# Patient Record
Sex: Female | Born: 1950 | Race: White | Hispanic: No | Smoking: Former smoker
Health system: Southern US, Community
[De-identification: ages and names within clinical notes are randomized; demographics above are authoritative.]

## PROBLEM LIST (undated history)

## (undated) DIAGNOSIS — F329 Major depressive disorder, single episode, unspecified: Secondary | ICD-10-CM

## (undated) DIAGNOSIS — K219 Gastro-esophageal reflux disease without esophagitis: Secondary | ICD-10-CM

## (undated) DIAGNOSIS — IMO0001 Reserved for inherently not codable concepts without codable children: Secondary | ICD-10-CM

## (undated) DIAGNOSIS — IMO0002 Reserved for concepts with insufficient information to code with codable children: Secondary | ICD-10-CM

## (undated) DIAGNOSIS — F32A Depression, unspecified: Secondary | ICD-10-CM

## (undated) DIAGNOSIS — M8580 Other specified disorders of bone density and structure, unspecified site: Secondary | ICD-10-CM

## (undated) HISTORY — DX: Reserved for concepts with insufficient information to code with codable children: IMO0002

## (undated) HISTORY — PX: HYSTEROSCOPY: SHX211

## (undated) HISTORY — PX: COLONOSCOPY: SHX174

## (undated) HISTORY — DX: Major depressive disorder, single episode, unspecified: F32.9

## (undated) HISTORY — PX: TUBAL LIGATION: SHX77

## (undated) HISTORY — DX: Depression, unspecified: F32.A

## (undated) HISTORY — DX: Other specified disorders of bone density and structure, unspecified site: M85.80

## (undated) HISTORY — DX: Reserved for inherently not codable concepts without codable children: IMO0001

## (undated) HISTORY — DX: Gastro-esophageal reflux disease without esophagitis: K21.9

## (undated) HISTORY — PX: WISDOM TOOTH EXTRACTION: SHX21

---

## 1997-06-25 ENCOUNTER — Ambulatory Visit (HOSPITAL_COMMUNITY): Admission: RE | Admit: 1997-06-25 | Discharge: 1997-06-25 | Payer: Self-pay | Admitting: Gynecology

## 1998-03-24 ENCOUNTER — Other Ambulatory Visit: Admission: RE | Admit: 1998-03-24 | Discharge: 1998-03-24 | Payer: Self-pay | Admitting: Gynecology

## 1999-03-10 ENCOUNTER — Other Ambulatory Visit: Admission: RE | Admit: 1999-03-10 | Discharge: 1999-03-10 | Payer: Self-pay | Admitting: Gynecology

## 1999-04-05 ENCOUNTER — Ambulatory Visit (HOSPITAL_COMMUNITY): Admission: RE | Admit: 1999-04-05 | Discharge: 1999-04-05 | Payer: Self-pay | Admitting: Gastroenterology

## 1999-04-05 ENCOUNTER — Encounter (INDEPENDENT_AMBULATORY_CARE_PROVIDER_SITE_OTHER): Payer: Self-pay

## 1999-04-29 ENCOUNTER — Encounter: Admission: RE | Admit: 1999-04-29 | Discharge: 1999-04-29 | Payer: Self-pay | Admitting: Family Medicine

## 1999-04-29 ENCOUNTER — Encounter: Payer: Self-pay | Admitting: Family Medicine

## 1999-06-03 ENCOUNTER — Ambulatory Visit (HOSPITAL_COMMUNITY): Admission: RE | Admit: 1999-06-03 | Discharge: 1999-06-03 | Payer: Self-pay | Admitting: Gynecology

## 1999-06-03 ENCOUNTER — Encounter (INDEPENDENT_AMBULATORY_CARE_PROVIDER_SITE_OTHER): Payer: Self-pay | Admitting: Specialist

## 2000-07-03 ENCOUNTER — Other Ambulatory Visit: Admission: RE | Admit: 2000-07-03 | Discharge: 2000-07-03 | Payer: Self-pay | Admitting: Gynecology

## 2003-03-23 ENCOUNTER — Other Ambulatory Visit: Admission: RE | Admit: 2003-03-23 | Discharge: 2003-03-23 | Payer: Self-pay | Admitting: Gynecology

## 2004-04-05 ENCOUNTER — Other Ambulatory Visit: Admission: RE | Admit: 2004-04-05 | Discharge: 2004-04-05 | Payer: Self-pay | Admitting: Gynecology

## 2004-07-13 ENCOUNTER — Encounter: Admission: RE | Admit: 2004-07-13 | Discharge: 2004-07-13 | Payer: Self-pay | Admitting: Family Medicine

## 2005-01-20 ENCOUNTER — Encounter: Admission: RE | Admit: 2005-01-20 | Discharge: 2005-01-20 | Payer: Self-pay | Admitting: Orthopaedic Surgery

## 2005-04-10 ENCOUNTER — Other Ambulatory Visit: Admission: RE | Admit: 2005-04-10 | Discharge: 2005-04-10 | Payer: Self-pay | Admitting: Gynecology

## 2006-04-11 ENCOUNTER — Other Ambulatory Visit: Admission: RE | Admit: 2006-04-11 | Discharge: 2006-04-11 | Payer: Self-pay | Admitting: Gynecology

## 2007-04-15 ENCOUNTER — Other Ambulatory Visit: Admission: RE | Admit: 2007-04-15 | Discharge: 2007-04-15 | Payer: Self-pay | Admitting: Gynecology

## 2008-01-01 ENCOUNTER — Encounter: Admission: RE | Admit: 2008-01-01 | Discharge: 2008-01-01 | Payer: Self-pay | Admitting: Family Medicine

## 2008-05-11 ENCOUNTER — Ambulatory Visit: Payer: Self-pay | Admitting: Gynecology

## 2008-05-11 ENCOUNTER — Other Ambulatory Visit: Admission: RE | Admit: 2008-05-11 | Discharge: 2008-05-11 | Payer: Self-pay | Admitting: Gynecology

## 2008-06-29 ENCOUNTER — Ambulatory Visit: Payer: Self-pay | Admitting: Gynecology

## 2008-07-01 ENCOUNTER — Ambulatory Visit: Payer: Self-pay | Admitting: Gynecology

## 2008-07-09 ENCOUNTER — Ambulatory Visit: Payer: Self-pay | Admitting: Gynecology

## 2010-03-22 ENCOUNTER — Emergency Department: Payer: Self-pay | Admitting: Unknown Physician Specialty

## 2010-05-22 DIAGNOSIS — IMO0002 Reserved for concepts with insufficient information to code with codable children: Secondary | ICD-10-CM

## 2010-05-22 HISTORY — DX: Reserved for concepts with insufficient information to code with codable children: IMO0002

## 2010-07-21 DIAGNOSIS — M8580 Other specified disorders of bone density and structure, unspecified site: Secondary | ICD-10-CM

## 2010-07-21 HISTORY — DX: Other specified disorders of bone density and structure, unspecified site: M85.80

## 2010-07-21 HISTORY — PX: CERVICAL BIOPSY  W/ LOOP ELECTRODE EXCISION: SUR135

## 2010-07-26 ENCOUNTER — Other Ambulatory Visit: Payer: Self-pay | Admitting: Gynecology

## 2010-07-26 ENCOUNTER — Encounter (INDEPENDENT_AMBULATORY_CARE_PROVIDER_SITE_OTHER): Payer: Managed Care, Other (non HMO) | Admitting: Gynecology

## 2010-07-26 ENCOUNTER — Other Ambulatory Visit (HOSPITAL_COMMUNITY)
Admission: RE | Admit: 2010-07-26 | Discharge: 2010-07-26 | Disposition: A | Discharge status: Home / Self Care | Payer: Managed Care, Other (non HMO) | Source: Ambulatory Visit | Attending: Gynecology | Admitting: Gynecology

## 2010-07-26 DIAGNOSIS — Z01419 Encounter for gynecological examination (general) (routine) without abnormal findings: Secondary | ICD-10-CM

## 2010-07-26 DIAGNOSIS — E559 Vitamin D deficiency, unspecified: Secondary | ICD-10-CM

## 2010-07-26 DIAGNOSIS — Z124 Encounter for screening for malignant neoplasm of cervix: Secondary | ICD-10-CM | POA: Insufficient documentation

## 2010-07-26 DIAGNOSIS — Z1159 Encounter for screening for other viral diseases: Secondary | ICD-10-CM | POA: Insufficient documentation

## 2010-07-28 ENCOUNTER — Encounter (INDEPENDENT_AMBULATORY_CARE_PROVIDER_SITE_OTHER): Payer: Managed Care, Other (non HMO)

## 2010-07-28 DIAGNOSIS — M899 Disorder of bone, unspecified: Secondary | ICD-10-CM

## 2010-07-28 DIAGNOSIS — M949 Disorder of cartilage, unspecified: Secondary | ICD-10-CM

## 2010-08-02 ENCOUNTER — Other Ambulatory Visit: Payer: Self-pay | Admitting: Gynecology

## 2010-08-02 ENCOUNTER — Ambulatory Visit (INDEPENDENT_AMBULATORY_CARE_PROVIDER_SITE_OTHER): Payer: Managed Care, Other (non HMO) | Admitting: Gynecology

## 2010-08-02 DIAGNOSIS — N870 Mild cervical dysplasia: Secondary | ICD-10-CM

## 2010-08-09 ENCOUNTER — Ambulatory Visit: Payer: Managed Care, Other (non HMO) | Admitting: Gynecology

## 2010-08-10 ENCOUNTER — Ambulatory Visit (INDEPENDENT_AMBULATORY_CARE_PROVIDER_SITE_OTHER): Payer: Managed Care, Other (non HMO) | Admitting: Gynecology

## 2010-08-10 ENCOUNTER — Other Ambulatory Visit: Payer: Self-pay | Admitting: Gynecology

## 2010-08-10 DIAGNOSIS — N870 Mild cervical dysplasia: Secondary | ICD-10-CM

## 2010-08-17 ENCOUNTER — Ambulatory Visit: Payer: Managed Care, Other (non HMO) | Admitting: Gynecology

## 2010-10-07 NOTE — Op Note (Signed)
[facility]  Patient:    Danielle Serrano                      MRN: [id number] Proc. Date: 06/03/99 Adm. 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  PROCEDURE:                    Hysteroscopic endometrial polyp resection, dilatation and curettage.  SURGEON:                      Timothy P. Fontaine, M.D.  ANESTHESIA:                   IV sedation with paracervical block, 1% lidocaine.  SPECIMENS:                    1. ______ .                               2. Endometrial polyp.  ESTIMATED BLOOD LOSS:         Minimal.  COMPLICATIONS:                None.  SORBITOL DISCREPANCY:         Minimal.  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  At end of the procedure, both cavities were inspected and were free of abnormalities.  Tubal ostia not visualized.  Lower uterine segment and endocervical canal both normal.  DESCRIPTION OF PROCEDURE:     Patient underwent IV sedation, was placed in low dorsal lithotomy position, received a perineovaginal preparation with Betadine solution, bladder emptied with in-and-out Foley catheterization, EUA performed nd patient was draped in the usual fashion.  Cervix was visualized with a speculum, grasped with a single-tooth tenaculum and a paracervical block using 1% lidocaine, approximately 5 cc per side, was placed.  The cervix was then gradually dilated to admit the operative hysteroscope and hysteroscopy was performed.  The left cavity was easily entered and a large polyp was found, which was resected  at its base.  Initial attempts to negotiate the right cavity were unsuccessful due to the smaller opening and subsequently, the patient was redilated with a preferential direction to the right and on re-hysteroscopy, this side had been dilated to allow inspection and the cavity was grossly normal to inspection.  A sharp curettage was then performed, careful to palpate and curette both cavities, and on re-hysteroscopy, again both cavities were visualized, were roughened, consistent with sampling of both cavities, and there was no evidence of perforation.  The hysteroscope was hen removed and tenaculum removed.  A small tear in the anterior cervical mucosa was oozing and subsequently, a 3-0 chromic in a running stitch was placed for hemostasis.  The patient was placed in the supine position and taken to recovery room in  good condition, having tolerated the procedure well. DD:  06/03/99 TD:  06/03/99 Job: [id number]

## 2011-08-01 ENCOUNTER — Encounter (HOSPITAL_COMMUNITY): Payer: Self-pay

## 2011-08-01 ENCOUNTER — Emergency Department (HOSPITAL_COMMUNITY): Payer: Managed Care, Other (non HMO)

## 2011-08-01 ENCOUNTER — Other Ambulatory Visit: Payer: Self-pay

## 2011-08-01 ENCOUNTER — Emergency Department (HOSPITAL_COMMUNITY)
Admission: EM | Admit: 2011-08-01 | Discharge: 2011-08-01 | Disposition: A | Discharge status: Home / Self Care | Payer: Managed Care, Other (non HMO) | Attending: Emergency Medicine | Admitting: Emergency Medicine

## 2011-08-01 DIAGNOSIS — R0789 Other chest pain: Secondary | ICD-10-CM | POA: Insufficient documentation

## 2011-08-01 DIAGNOSIS — Z87891 Personal history of nicotine dependence: Secondary | ICD-10-CM | POA: Insufficient documentation

## 2011-08-01 DIAGNOSIS — M542 Cervicalgia: Secondary | ICD-10-CM | POA: Insufficient documentation

## 2011-08-01 DIAGNOSIS — R05 Cough: Secondary | ICD-10-CM | POA: Insufficient documentation

## 2011-08-01 DIAGNOSIS — R059 Cough, unspecified: Secondary | ICD-10-CM | POA: Insufficient documentation

## 2011-08-01 DIAGNOSIS — R079 Chest pain, unspecified: Secondary | ICD-10-CM | POA: Insufficient documentation

## 2011-08-01 LAB — CBC
HCT: 38.2 % (ref 36.0–46.0)
Hemoglobin: 12.9 g/dL (ref 12.0–15.0)
MCH: 28.3 pg (ref 26.0–34.0)
MCHC: 33.8 g/dL (ref 30.0–36.0)
MCV: 83.8 fL (ref 78.0–100.0)
Platelets: 214 10*3/uL (ref 150–400)
RBC: 4.56 MIL/uL (ref 3.87–5.11)
RDW: 12.7 % (ref 11.5–15.5)
WBC: 5.7 10*3/uL (ref 4.0–10.5)

## 2011-08-01 LAB — URINALYSIS, ROUTINE W REFLEX MICROSCOPIC
Bilirubin Urine: NEGATIVE
Glucose, UA: NEGATIVE mg/dL
Hgb urine dipstick: NEGATIVE
Ketones, ur: NEGATIVE mg/dL
Leukocytes, UA: NEGATIVE
Nitrite: NEGATIVE
Protein, ur: NEGATIVE mg/dL
Specific Gravity, Urine: 1.01 (ref 1.005–1.030)
Urobilinogen, UA: 0.2 mg/dL (ref 0.0–1.0)
pH: 6.5 (ref 5.0–8.0)

## 2011-08-01 LAB — POCT I-STAT TROPONIN I
Troponin i, poc: 0 ng/mL (ref 0.00–0.08)
Troponin i, poc: 0 ng/mL (ref 0.00–0.08)

## 2011-08-01 LAB — COMPREHENSIVE METABOLIC PANEL
ALT: 16 U/L (ref 0–35)
AST: 16 U/L (ref 0–37)
Albumin: 4.2 g/dL (ref 3.5–5.2)
Alkaline Phosphatase: 77 U/L (ref 39–117)
BUN: 16 mg/dL (ref 6–23)
CO2: 28 mEq/L (ref 19–32)
Calcium: 9.4 mg/dL (ref 8.4–10.5)
Chloride: 102 mEq/L (ref 96–112)
Creatinine, Ser: 1.1 mg/dL (ref 0.50–1.10)
GFR calc Af Amer: 63 mL/min — ABNORMAL LOW (ref >90)
GFR calc non Af Amer: 54 mL/min — ABNORMAL LOW (ref >90)
Glucose, Bld: 88 mg/dL (ref 70–99)
Potassium: 3.8 mEq/L (ref 3.5–5.1)
Sodium: 137 mEq/L (ref 135–145)
Total Bilirubin: 0.5 mg/dL (ref 0.3–1.2)
Total Protein: 7.2 g/dL (ref 6.0–8.3)

## 2011-08-01 MED ORDER — SODIUM CHLORIDE 0.9 % IV BOLUS (SEPSIS)
1000.0000 mL | Freq: Once | INTRAVENOUS | Status: AC
  Administered 2011-08-01: 1000 mL via INTRAVENOUS

## 2011-08-01 MED ORDER — OXYCODONE-ACETAMINOPHEN 5-325 MG PO TABS
2.0000 | ORAL_TABLET | Freq: Once | ORAL | Status: AC
  Administered 2011-08-01: 2 via ORAL
  Filled 2011-08-01: qty 2

## 2011-08-01 MED ORDER — IBUPROFEN 800 MG PO TABS: 800.0000 mg | ORAL_TABLET | Freq: Three times a day (TID) | ORAL | Status: AC

## 2011-08-01 MED ORDER — ASPIRIN 81 MG PO CHEW
324.0000 mg | CHEWABLE_TABLET | Freq: Once | ORAL | Status: AC
  Administered 2011-08-01: 324 mg via ORAL
  Filled 2011-08-01: qty 4

## 2011-08-01 MED ORDER — DIAZEPAM 5 MG PO TABS
5.0000 mg | ORAL_TABLET | Freq: Once | ORAL | Status: AC
  Administered 2011-08-01: 5 mg via ORAL
  Filled 2011-08-01: qty 1

## 2011-08-01 MED ORDER — KETOROLAC TROMETHAMINE 30 MG/ML IJ SOLN
30.0000 mg | Freq: Once | INTRAMUSCULAR | Status: AC
  Administered 2011-08-01: 30 mg via INTRAVENOUS
  Filled 2011-08-01: qty 1

## 2011-08-01 MED ORDER — OXYCODONE-ACETAMINOPHEN 5-325 MG PO TABS: 2.0000 | ORAL_TABLET | Freq: Four times a day (QID) | ORAL | Status: AC | PRN

## 2011-08-01 NOTE — ED Notes (Signed)
Patient here with chestpain and neckpain that started this am 0630. Reports some tingling to right arm. Patient denies any other associated symptoms. Reports intermittent dry cough x 1 month. 12 lead done on arrival

## 2011-08-01 NOTE — Discharge Instructions (Signed)
Chest Pain (Nonspecific) It is often hard to give a specific diagnosis for the cause of chest pain. There is always a chance that your pain could be related to something serious, such as a heart attack or a blood clot in the lungs. You need to follow up with your caregiver for further evaluation. CAUSES   Heartburn.   Pneumonia or bronchitis.   Anxiety or stress.   Inflammation around your heart (pericarditis) or lung (pleuritis or pleurisy).   A blood clot in the lung.   A collapsed lung (pneumothorax). It can develop suddenly on its own (spontaneous pneumothorax) or from injury (trauma) to the chest.   Shingles infection (herpes zoster virus).  The chest wall is composed of bones, muscles, and cartilage. Any of these can be the source of the pain.  The bones can be bruised by injury.   The muscles or cartilage can be strained by coughing or overwork.   The cartilage can be affected by inflammation and become sore (costochondritis).  DIAGNOSIS  Lab tests or other studies, such as X-rays, electrocardiography, stress testing, or cardiac imaging, may be needed to find the cause of your pain.  TREATMENT   Treatment depends on what may be causing your chest pain. Treatment may include:   Acid blockers for heartburn.   Anti-inflammatory medicine.   Pain medicine for inflammatory conditions.   Antibiotics if an infection is present.   You may be advised to change lifestyle habits. This includes stopping smoking and avoiding alcohol, caffeine, and chocolate.   You may be advised to keep your head raised (elevated) when sleeping. This reduces the chance of acid going backward from your stomach into your esophagus.   Most of the time, nonspecific chest pain will improve within 2 to 3 days with rest and mild pain medicine.  HOME CARE INSTRUCTIONS   If antibiotics were prescribed, take your antibiotics as directed. Finish them even if you start to feel better.   For the next few  days, avoid physical activities that bring on chest pain. Continue physical activities as directed.   Do not smoke.   Avoid drinking alcohol.   Only take over-the-counter or prescription medicine for pain, discomfort, or fever as directed by your caregiver.   Follow your caregiver's suggestions for further testing if your chest pain does not go away.   Keep any follow-up appointments you made. If you do not go to an appointment, you could develop lasting (chronic) problems with pain. If there is any problem keeping an appointment, you must call to reschedule.  SEEK MEDICAL CARE IF:   You think you are having problems from the medicine you are taking. Read your medicine instructions carefully.   Your chest pain does not go away, even after treatment.   You develop a rash with blisters on your chest.  SEEK IMMEDIATE MEDICAL CARE IF:   You have increased chest pain or pain that spreads to your arm, neck, jaw, back, or abdomen.   You develop shortness of breath, an increasing cough, or you are coughing up blood.   You have severe back or abdominal pain, feel nauseous, or vomit.   You develop severe weakness, fainting, or chills.   You have a fever.  THIS IS AN EMERGENCY. Do not wait to see if the pain will go away. Get medical help at once. Call your local emergency services (911 in [place] Do not drive yourself to the hospital. MAKE SURE YOU:   Understand these instructions.     Will watch your condition.   Will get help right away if you are not doing well or get worse.  Document Released: 02/15/2005 Document Revised: 04/27/2011 Document Reviewed: 12/12/2007 [employer] Patient Information 2012 [employer]. 

## 2011-08-01 NOTE — ED Provider Notes (Signed)
History     CSN: [id number]  Arrival date & time 08/01/11  1345   First MD Initiated Contact with Patient 08/01/11 1502      Chief Complaint  Patient presents with  . Chest Pain    w/neck pain since this am.    . Cough    x 1 month    (Consider location/radiation/quality/duration/timing/severity/associated sxs/prior treatment) HPI 61 yo F presents today complaining of left-sided chest tightness, right-sided chest pain, and neck pain. Patient has had a dry cough for the past month. She noted the discomfort in her chest this morning. Patient denies any fevers. Her cough has been nonproductive. Patient is a former smoker but stopped smoking one year ago. She denies any associated shortness of breath, nausea, vomiting, GI symptoms, or GU symptoms. She has not had any neurologic symptoms including numbness, tingling, or paresthesias. Patient denies headache or rashes. Patient has no known sick contacts. Pain is worse with coughing or chest wall movement. It is also worse with movement.   Patient has no history of coronary artery disease, diabetes, hypertension, hypercholesterolemia, or early family coronary artery disease. She has had a negative stress test in the recent past and does see a Dr. on a regular basis. She last had a checkup with Dr. Clelia Croft a few months ago and was told everything was fine.There are no other associated or modifying factors.  History reviewed. No pertinent past medical history.  History reviewed. No pertinent past surgical history.  History reviewed. No pertinent family history.  History  Substance Use Topics  . Smoking status: Former Games developer  . Smokeless tobacco: Not on file  . Alcohol Use: Yes     occassional    OB History    Grav Para Term Preterm Abortions TAB SAB Ect Mult Living                  Review of Systems  Constitutional: Negative.   HENT: Positive for neck pain.   Eyes: Negative.   Respiratory: Positive for cough.   Cardiovascular:  Positive for chest pain.  Gastrointestinal: Negative.   Genitourinary: Negative.   Skin: Negative.   Neurological: Negative.   Hematological: Negative.   Psychiatric/Behavioral: Negative.   All other systems reviewed and are negative.    Allergies  Penicillins  Home Medications   Current Outpatient Rx  Name Route Sig Dispense Refill  . ADULT MULTIVITAMIN W/MINERALS CH Oral Take 1 tablet by mouth daily.    Marland Kitchen OMEPRAZOLE 20 MG PO CPDR Oral Take 20 mg by mouth daily.      BP 126/68  Pulse 85  Temp 98.5 F (36.9 C)  Resp 20  SpO2 96%  Physical Exam  Nursing note and vitals reviewed. GEN: Well-developed, well-nourished female in no distress HEENT: Atraumatic, normocephalic. Oropharynx clear without erythema EYES: PERRLA BL, no scleral icterus. NECK: Trachea midline, no meningismus, patient discomfort along the right sternocleidomastoid that is worse with range of motion. She does not have limitation of her range of motion. There is no nuchal rigidity. CV: regular rate and rhythm. No murmurs, rubs, or gallops patient does have tenderness in her chest that is noted with various movements of her head neck and shoulders. PULM: No respiratory distress.  No crackles, wheezes, or rales. GI: soft, non-tender. No guarding, rebound, or tenderness. + bowel sounds  GU: deferred Neuro: cranial nerves 2-12 intact, no abnormalities of strength or sensation, A and O x 3 MSK: Patient moves all 4 extremities symmetrically, no deformity, edema,  or injury noted Skin: No rashes petechiae, purpura, or jaundice Psych: no abnormality of mood   ED Course  Procedures (including critical care time)   Date: 08/01/2011  Rate: 75  Rhythm: normal sinus rhythm  QRS Axis: normal  Intervals: normal  ST/T Wave abnormalities: nonspecific T wave changes  Conduction Disutrbances:none  Narrative Interpretation: No signs of acute ischemic disease  Old EKG Reviewed: none available   Labs Reviewed    COMPREHENSIVE METABOLIC PANEL - Abnormal; Notable for the following:    GFR calc non Af Amer 54 (*)    GFR calc Af Amer 63 (*)    All other components within normal limits  CBC  URINALYSIS, ROUTINE W REFLEX MICROSCOPIC  POCT I-STAT TROPONIN I  POCT I-STAT TROPONIN I   Dg Chest 2 View  08/01/2011  *RADIOLOGY REPORT*  Clinical Data: Chest pain.  CHEST - 2 VIEW  Comparison: 01/01/2008.  Findings: The cardiac silhouette, mediastinal and hilar contours are within normal limits.  The lungs are clear.  No pleural effusion or pneumothorax.  The bony thorax is intact.  IMPRESSION: No acute cardiopulmonary findings.  Original Report Authenticated By: P. Loralie Champagne, M.D.     1. Chest pain   2. Neck pain       MDM  Patient was evaluated by myself. Description of her symptoms was not consistent with ACS the patient is a 61 year old female. Screening labs were ordered. EKG showed no acute ischemic changes. Chest x-ray, CBC, renal panel, and troponin are pending at this time. Patient does not have any symptoms concerning for meningitis given her neck pain as well. I did give her aspirin while results are pending. Patient was also given vallium to see if she had any improvement in her neck and chest wall symptoms which may be related to muscle spasm. Patient will have a three-hour troponin drawn. If all lab results are within normal limits no continue to control patient's symptoms.  Patient had mild improvement with Valium. She remained hemodynamically stable. She was given Toradol and also had significant improvement down to a 2/10 for her pain. Patient had 3 hour troponin which was negative. She reported that her pain started to come back and she was given 2 tabs of Percocet. Patient was discharged in good condition. She will followup with her primary care physician. Patient is to discuss possible stress testing at that time. She was discharged home with prescription for ibuprofen as well as 15 tabs  of Percocet.         Cyndra Numbers, MD 08/02/11 [phone]

## 2011-08-01 NOTE — ED Notes (Signed)
EKG GIVEN to DR. [employer]

## 2011-08-01 NOTE — ED Notes (Signed)
[id number]> Expected date:<BR> Expected time: 1:53 PM<BR> Means of arrival:<BR> Comments:<BR> M31 - 44yoM ETOH, detox

## 2011-10-18 ENCOUNTER — Encounter: Payer: Self-pay | Admitting: *Deleted

## 2011-10-24 ENCOUNTER — Other Ambulatory Visit (HOSPITAL_COMMUNITY)
Admission: RE | Admit: 2011-10-24 | Discharge: 2011-10-24 | Disposition: A | Discharge status: Home / Self Care | Payer: Managed Care, Other (non HMO) | Source: Ambulatory Visit | Attending: Gynecology | Admitting: Gynecology

## 2011-10-24 ENCOUNTER — Ambulatory Visit (INDEPENDENT_AMBULATORY_CARE_PROVIDER_SITE_OTHER): Payer: Managed Care, Other (non HMO) | Admitting: Gynecology

## 2011-10-24 ENCOUNTER — Encounter: Payer: Self-pay | Admitting: Gynecology

## 2011-10-24 VITALS — BP 112/70 | Ht 63.25 in | Wt 142.0 lb

## 2011-10-24 DIAGNOSIS — N952 Postmenopausal atrophic vaginitis: Secondary | ICD-10-CM

## 2011-10-24 DIAGNOSIS — R87612 Low grade squamous intraepithelial lesion on cytologic smear of cervix (LGSIL): Secondary | ICD-10-CM

## 2011-10-24 DIAGNOSIS — Z01419 Encounter for gynecological examination (general) (routine) without abnormal findings: Secondary | ICD-10-CM | POA: Insufficient documentation

## 2011-10-24 DIAGNOSIS — R87810 Cervical high risk human papillomavirus (HPV) DNA test positive: Secondary | ICD-10-CM | POA: Insufficient documentation

## 2011-10-24 DIAGNOSIS — IMO0002 Reserved for concepts with insufficient information to code with codable children: Secondary | ICD-10-CM

## 2011-10-24 NOTE — Patient Instructions (Signed)
Follow up for Pap smear results. If negative then follow up in one year for annual exam. If positive then will plan repeat colposcopy.

## 2011-10-24 NOTE — Progress Notes (Signed)
Addended by: Richardson Chiquito on: 10/24/2011 11:55 AM   Modules accepted: Orders

## 2011-10-24 NOTE — Progress Notes (Signed)
Danielle Serrano 20-Jun-1950 [id number]        61 y.o.  for annual exam.  Several issues noted below.  Past medical history,surgical history, medications, allergies, family history and social history were all reviewed and documented in the EPIC chart. ROS:  Was performed and pertinent positives and negatives are included in the history.  Exam: Danielle Serrano chaperone present Filed Vitals:   10/24/11 1032  BP: 112/70   General appearance  Normal Skin grossly normal Head/Neck normal with no cervical or supraclavicular adenopathy thyroid normal Lungs  clear Cardiac RR, without RMG Abdominal  soft, nontender, without masses, organomegaly or hernia Breasts  examined lying and sitting without masses, retractions, discharge or axillary adenopathy. Right nipple dimpled as always has been easily reverts. Pelvic  Ext/BUS/vagina  normal with atrophic genital changes  Cervix  Scarred, flush with upper vagina Pap done  Uterus  Axial to retroverted, normal size, shape and contour, midline and mobile nontender   Adnexa  Without masses or tenderness    Anus and perineum  normal   Rectovaginal  normal sphincter tone without palpated masses or tenderness.    Assessment/Plan:  61 y.o. female for annual exam.    1. 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 Status post LEEP showing low-grade SIL with positive ectocervical margin, negative endocervical margin. Pap done today with HPV. If negative follow up in one year repeat. If positive recolposcopy. 2. Osteopenia. DEXA 2012 T score -2.2 with FRAX showing 10 year probability of fracture major osteoporotic at 8.8% and hip fracture at 1.3%.  Repeat DEXA next year. Increase calcium vitamin D reviewed. Asked patient to have vitamin D level checked when she has blood drawn at her primary physician's office next month as scheduled. 3. Colonoscopy. Patient's due in 2013 and knows to schedule. 4. Mammography. Patient due for mammogram now and knows to  schedule. SBE monthly reviewed. Right nipple dimpled as always has been, easily reverts. 5. Health maintenance. No blood work was done today as it is all done through her primary physician's office who she sees on a regular basis.    Danielle Serrano, 11:06 AM 10/24/2011

## 2011-10-25 LAB — URINALYSIS W MICROSCOPIC + REFLEX CULTURE
Bacteria, UA: NONE SEEN
Bilirubin Urine: NEGATIVE
Casts: NONE SEEN
Crystals: NONE SEEN
Glucose, UA: NEGATIVE mg/dL
Hgb urine dipstick: NEGATIVE
Ketones, ur: NEGATIVE mg/dL
Leukocytes, UA: NEGATIVE
Nitrite: NEGATIVE
Protein, ur: NEGATIVE mg/dL
Specific Gravity, Urine: 1.009 (ref 1.005–1.030)
Squamous Epithelial / LPF: NONE SEEN
Urobilinogen, UA: 0.2 mg/dL (ref 0.0–1.0)
pH: 7 (ref 5.0–8.0)

## 2011-10-30 ENCOUNTER — Encounter: Payer: Self-pay | Admitting: Gynecology

## 2011-11-07 ENCOUNTER — Ambulatory Visit (INDEPENDENT_AMBULATORY_CARE_PROVIDER_SITE_OTHER): Payer: Managed Care, Other (non HMO) | Admitting: Gynecology

## 2011-11-07 ENCOUNTER — Encounter: Payer: Self-pay | Admitting: Gynecology

## 2011-11-07 DIAGNOSIS — R87810 Cervical high risk human papillomavirus (HPV) DNA test positive: Secondary | ICD-10-CM

## 2011-11-07 DIAGNOSIS — N952 Postmenopausal atrophic vaginitis: Secondary | ICD-10-CM

## 2011-11-07 DIAGNOSIS — IMO0001 Reserved for inherently not codable concepts without codable children: Secondary | ICD-10-CM

## 2011-11-07 DIAGNOSIS — R87610 Atypical squamous cells of undetermined significance on cytologic smear of cervix (ASC-US): Secondary | ICD-10-CM

## 2011-11-07 MED ORDER — ESTRADIOL 0.1 MG/GM VA CREA: 2.0000 g | TOPICAL_CREAM | Freq: Every day | VAGINAL | Status: DC

## 2011-11-07 NOTE — Patient Instructions (Signed)
Use estrogen vaginal cream nightly add 2 grams.  Follow up in 4-6 weeks for colposcopy.

## 2011-11-07 NOTE — Progress Notes (Signed)
Patient presents for colposcopy. History of low-grade SIL changes with positive high-risk HPV and a positive ECC.  Underwent LEEP 07/2010 with low-grade SIL positive ectocervical margin negative endocervical margin. Pap smear with HPV most recently showed ASCUS with positive high risk HPV.  Exam with [name] chaperone present Pelvic external BUS vagina normal mild atrophic changes. Cervix flush with the upper vagina. Uterus normal size retroverted midline mobile nontender. Adnexa without masses or tenderness  Colposcopy acetic acid cleanse in adequate with no cervical os noted. Cervix flush with the upper vagina. Single-tooth tenaculum application with manipulation still unable to locate os.  Assessment and plan: History of low-grade SIL positive ECC last year. Follow up LEEP with positive ectocervical margins low grade SIL negative endocervical margins. Most recent Pap smear showing ASCUS with positive high-risk HPV. Endocervical cells were seen. Colposcopy now is in adequate with no cervical os noted despite manipulation with tenaculum. Options for management reviewed. We'll plan estrogen vaginal cream 2 g slightly x1 month and repeat colposcopy. The issues of absorption and risks of ERT discussed. Ultimately if unable to clear the cervix possible need for hysterectomy discussed.

## 2011-12-11 ENCOUNTER — Ambulatory Visit (INDEPENDENT_AMBULATORY_CARE_PROVIDER_SITE_OTHER): Payer: Managed Care, Other (non HMO) | Admitting: Gynecology

## 2011-12-11 ENCOUNTER — Encounter: Payer: Self-pay | Admitting: Gynecology

## 2011-12-11 DIAGNOSIS — R87612 Low grade squamous intraepithelial lesion on cytologic smear of cervix (LGSIL): Secondary | ICD-10-CM

## 2011-12-11 DIAGNOSIS — IMO0002 Reserved for concepts with insufficient information to code with codable children: Secondary | ICD-10-CM

## 2011-12-11 MED ORDER — MEDROXYPROGESTERONE ACETATE 10 MG PO TABS: 10.0000 mg | ORAL_TABLET | Freq: Two times a day (BID) | ORAL | Status: DC

## 2011-12-11 NOTE — Patient Instructions (Signed)
Use the estrogen cream twice daily. Take progesterone pills twice daily for 5 days a week or so before her appointment to see me.

## 2011-12-11 NOTE — Progress Notes (Signed)
Patient presents with history of low-grade SIL positive ECC a year ago. Underwent LEEP showed low-grade SIL with positive ectocervical margins, negative endocervical margins. Follow up Pap smear June 2013 showed ASCUS with positive high-risk HPV. She presented for colposcopy and no cervical os was visualized. There were no abnormalities but could not visualize an opening. Her Pap smear do show endocervical cells. She was placed on Estrace vaginal cream nightly for one month and represent now for colposcopy.  Exam was Kim assistant Pelvic external BUS vagina normal. Cervix flush with upper vagina. Bimanual cervix palpates normal. Uterus normal size midline mobile nontender. Adnexa without masses or tenderness.  Colposcopy, acetic acid cleanse with no external os visualized. Single tooth tenaculum applied to the cervix and the cervix was manipulated probed with a wire attempting to find the os and I was unable to do so.  Assessment and plan: Persistent low-grade changes positive high-risk HPV unable to identify cervical os. Will continue estrogen supplementation for another month and change to Elestrin pump transdermal with 2 pumps daily. She will use Provera 10 mg twice a day the week before colposcopy be off of this for several days and then we'll colposcope her and hopefully she'll have a small withdrawal bleed that will be able to identify the os. If we are unable to do so options to include gynecologic oncologist referral for their second opinion up to and including possible hysterectomy was discussed with her. She has a clear understanding about the issues and my concern about not being able to clear the endocervical canal.

## 2012-01-08 ENCOUNTER — Ambulatory Visit (INDEPENDENT_AMBULATORY_CARE_PROVIDER_SITE_OTHER): Payer: Managed Care, Other (non HMO) | Admitting: Gynecology

## 2012-01-08 ENCOUNTER — Encounter: Payer: Self-pay | Admitting: Gynecology

## 2012-01-08 DIAGNOSIS — R87810 Cervical high risk human papillomavirus (HPV) DNA test positive: Secondary | ICD-10-CM

## 2012-01-08 DIAGNOSIS — R87612 Low grade squamous intraepithelial lesion on cytologic smear of cervix (LGSIL): Secondary | ICD-10-CM

## 2012-01-08 DIAGNOSIS — IMO0002 Reserved for concepts with insufficient information to code with codable children: Secondary | ICD-10-CM

## 2012-01-08 NOTE — Patient Instructions (Signed)
Office will contact you to arrange referral to a gynecologic oncologist. If you do not hear from the office in several days call us.

## 2012-01-08 NOTE — Progress Notes (Signed)
Colposcopy   Patient presents with history of numerous normal Pap smears until March 2012 when her Pap smear showed low-grade SIL. High-risk HPV screen was positive.  Patient underwent colposcopy which was in adequate where no transformation zone was seen. She had an ECC which was positive for low-grade SIL. She ultimately underwent a LEEP due to the positive ECC which showed low-grade SIL changes present at the ectocervical margin but the endocervical margin was negative. Follow up Pap smear in June 2013 showed ASCUS, positive high-risk HPV and on colposcopy her cervical os could not be detected. She was placed on vaginal estrogen cream times one month and on re\re colposcopy could not see a cervical os. She was placed on Elestrin pump twice daily with a progesterone withdrawal fishing and several days ago and presents now for colposcopy.  Exam external BUS vagina normal. Cervix flush with the upper vagina. Bimanual uterus normal size midline mobile nontender. Adnexa without masses or tenderness.  Colposcopy without cervical os detected. Cervical stroma palpated and grasped with single-tooth tenaculum and the overlying mucosa vigorously probed and I could not enter the canal.  Assessment and plan: Low-grade SIL status post LEEP with positive ectocervical margin. Pap smear now showing ASCUS with positive high-risk HPV.  They did report the Pap smear was adequate which implies endocervical cells but I cannot find an os and the issue as to possible hidden disease that may progress over time was discussed with the patient. Options for management were reviewed to include expectant management with Pap smears every 6 months, more aggressive treatment if worsening again accepting the possibility of underlying worsening disease that is not picked up on Pap smear, LEEP over the palpable cervix to open the canal but the cervix is flush with the upper vagina and the risk of bladder vessel rectum damage discussed and  the possibility of not finding an os regardless, hysterectomy accepting it might be overkill for no pathology found or low-grade changes and lastly referral to a gynecologic oncologist for their opinion. After a lengthy discussion we both agree second opinion with a gynecologic oncologist is arrangements.

## 2012-01-09 ENCOUNTER — Telehealth: Payer: Self-pay | Admitting: *Deleted

## 2012-01-09 NOTE — Telephone Encounter (Signed)
Message copied by Aura Camps on Tue Jan 09, 2012 10:01 AM ------      Message from: Keenan Bachelor      Created: Mon Jan 08, 2012 11:36 AM                   ----- Message -----         From: Dara Lords, MD         Sent: 01/08/2012  11:22 AM           To: Keenan Bachelor            Schedule appointment for consult/colposcopy with gynecologic oncology reference summarized in my last office note

## 2012-01-09 NOTE — Telephone Encounter (Signed)
Appointment on 01/25/12 @ 10:00am @ cancer center Dr. Nelly Rout  pt informed with this.

## 2012-01-16 ENCOUNTER — Encounter: Payer: Self-pay | Admitting: Gynecology

## 2012-01-25 ENCOUNTER — Encounter: Payer: Self-pay | Admitting: Gynecologic Oncology

## 2012-01-25 ENCOUNTER — Ambulatory Visit: Payer: Managed Care, Other (non HMO) | Attending: Gynecologic Oncology | Admitting: Gynecologic Oncology

## 2012-01-25 VITALS — BP 98/54 | HR 58 | Temp 98.0°F | Resp 16 | Ht 62.99 in | Wt 136.7 lb

## 2012-01-25 DIAGNOSIS — Z79899 Other long term (current) drug therapy: Secondary | ICD-10-CM | POA: Insufficient documentation

## 2012-01-25 DIAGNOSIS — IMO0002 Reserved for concepts with insufficient information to code with codable children: Secondary | ICD-10-CM

## 2012-01-25 DIAGNOSIS — R8789 Other abnormal findings in specimens from female genital organs: Secondary | ICD-10-CM | POA: Insufficient documentation

## 2012-01-25 DIAGNOSIS — R87610 Atypical squamous cells of undetermined significance on cytologic smear of cervix (ASC-US): Secondary | ICD-10-CM | POA: Insufficient documentation

## 2012-01-25 NOTE — Progress Notes (Signed)
Consult Note: Gyn-Onc  Consult was requested by Dr. Audie Box for the evaluation of Danielle Serrano 61 y.o. female  CC:  Chief Complaint  Patient presents with  . LGSIL    New consult    HPI:  G3P3 LNMP 10 years ago.  Patient with ASCUS pap.  First abnormal pap 3-4 years ago.  Abnormal pap in [address] and ECC CIN I.  Patient underwent LEEP 07/2010 path LEEP, and button - LOW GRADE SQUAMOUS INTRAEPITHELIAL LESION, CIN-I (MILD DYSPLASIA). - LOW GRADE DYSPLASIA PRESENT AT ECTOCERVICAL MARGIN, SEE COMMENT. 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  No vaginal bleeding. Per Dr. Reynold Bowen report it was not possible to visualize a cervix and on palpation it was very small.  Patient reports tob use 1/2 PPD.  Current Meds:  Outpatient Encounter Prescriptions as of 01/25/2012  Medication Sig Dispense Refill  . escitalopram (LEXAPRO) 10 MG tablet Take 10 mg by mouth daily.      . Multiple Vitamin (MULITIVITAMIN WITH MINERALS) TABS Take 1 tablet by mouth daily.      Marland Kitchen omeprazole (PRILOSEC) 20 MG capsule Take 20 mg by mouth daily.      . citalopram (CELEXA) 10 MG tablet Take 10 mg by mouth daily.      Marland Kitchen estradiol (ESTRACE VAGINAL) 0.1 MG/GM vaginal cream Place 0.25 Applicatorfuls vaginally daily.  42.5 g  1  . medroxyPROGESTERone (PROVERA) 10 MG tablet Take 1 tablet (10 mg total) by mouth 2 (two) times daily.  10 tablet  0    Allergy:  Allergies  Allergen Reactions  . Penicillins Other (See Comments)    Unknown childhood allergy    Social Hx:   History   Social History  . Marital Status: Married    Spouse Name: N/A    Number of Children: N/A  . Years of Education: N/A   Occupational History  . Not on file.   Social History Main Topics  . Smoking status: Current Everyday Smoker -- 0.5 packs/day for 20 years    Types: Cigarettes  . Smokeless tobacco: Never Used   Comment: pt has smoked for 20 years, she quit smoking for a 2 year  period and recently started back in the last few months.  . Alcohol Use: Yes     occassional  . Drug Use: No  . Sexually Active: Yes    Birth Control/ Protection: Post-menopausal, Surgical   Other Topics Concern  . Not on file   Social History Narrative  . No narrative on file    Past Surgical Hx:  Past Surgical History  Procedure Date  . Tubal ligation   . Hysteroscopy 92, 2001    w/D&C for benign polyp  . Cervical biopsy  w/ loop electrode excision 07/2010    LGSIL positive ectocervical margin negative endocervical margin    Past Medical Hx:  Past Medical History  Diagnosis Date  . LGSIL (low grade squamous intraepithelial dysplasia) 2012    +ECC +HRHPV  . Osteopenia 07/2010    t score - 2.2, FRAX 8.8%/1.3%  . Depression   . Reflux     Past Gynecological History:  G3 P3 Menarche 17 menopause 10 years ago.  Regular menses.   No LMP recorded. Patient is postmenopausal. STD, new relationship Colonoscopy 2009 Mammogram 2013  Family Hx:  Family History  Problem Relation Age of Onset  . Cancer Father     colon  at age 77.  Review of Systems:  Constitutional  Feels well,   Cardiovascular  No chest pain, shortness of breath, or edema  Pulmonary  No cough or wheeze.  Gastro Intestinal  No nausea, vomitting, or diarrhoea. No bright red blood per rectum, no abdominal pain, change in bowel movement, or constipation.  Genito Urinary  No frequency, urgency, dysuria, no vaginal bleeding Musculo Skeletal  No myalgia, arthralgia, joint swelling or pain  Neurologic  No weakness, numbness, change in gait,  Psychology  No depression, anxiety, insomnia.   Vitals:  Blood pressure 98/54, pulse 58, temperature 98 F (36.7 C), temperature source Oral, resp. rate 16, height 5' 2.99" (1.6 m), weight 136 lb 11.2 oz (62.007 kg).  Physical Exam: WD in NAD Neck  Supple NROM, without any enlargements.  Lymph Node Survey No cervical supraclavicular or inguinal  adenopathy Cardiovascular  Pulse normal rate, regularity and rhythm. S1 and S2 normal.  Lungs  Clear to auscultation bilateraly. Good air movement.  Psychiatry  Alert and oriented to person, place, and time  Abdomen  Normoactive bowel sounds, abdomen soft, non-tender and obese.  Back No CVA tenderness Genito Urinary  Vulva/vagina: Normal external female genitalia.  No lesions. No discharge or bleeding.  Bladder/urethra:  No lesions or masses  Vagina: atrophic.  No visible cervix.  Two small dimples appreciated however I was not able to cannulate these dimples.  Vinegar placed in the vagina without any acetowhite changes.   Palpable 8mm? Cervix.  Uterus palpable and mobile.    Adnexa: No palpable masses. Rectal  Good tone, no masses no cul de sac nodularity. Unable to palpate a cervix. Extremities  No bilateral cyanosis, clubbing or edema.   Assessment/Plan:  Ms. Danielle Serrano  is a 61 y.o.  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  Unable to visualize or palpate a cervix.  Certainly the epitamy of an unsatisfactory colposcopy.  I don't think that the cervix is large enough to safely facilitate a CKC.    I recommended to the patient and her friend that she undergo hysterectomy.  Although not likely,  If invasive disease is encountered then a parametrectomy and LND will have to be considered. We would be happy to see Danielle Serrano at that time to make additional recommendations.    The patient is aware that there is a 5% likelihood that she may develop vaginal dysplasia after the hysterectomy.  This plan was discussed with Dr. Audie Box.  All of the patient's questions were answered to her satisfaction.   Laurette Schimke, MD, PhD 01/25/2012, 10:44 AM

## 2012-01-25 NOTE — Patient Instructions (Signed)
F/U with Dr. Audie Box for discussion regarding hysteretomy  Thank you very much Ms. Danielle Serrano for allowing me to provide care for you today.  I appreciate your confidence in choosing our Gynecologic Oncology team.  If you have any questions about your visit today please call our office and we will get back to you as soon as possible.  Maryclare Labrador. Brewster MD., PhD Gynecologic Oncology

## 2012-01-29 ENCOUNTER — Encounter: Payer: Self-pay | Admitting: Gynecology

## 2012-01-29 ENCOUNTER — Ambulatory Visit (INDEPENDENT_AMBULATORY_CARE_PROVIDER_SITE_OTHER): Payer: Managed Care, Other (non HMO) | Admitting: Gynecology

## 2012-01-29 DIAGNOSIS — R87612 Low grade squamous intraepithelial lesion on cytologic smear of cervix (LGSIL): Secondary | ICD-10-CM

## 2012-01-29 DIAGNOSIS — IMO0002 Reserved for concepts with insufficient information to code with codable children: Secondary | ICD-10-CM

## 2012-01-29 NOTE — Patient Instructions (Signed)
Office will contact you to arrange surgery. 

## 2012-01-29 NOTE — Progress Notes (Signed)
Patient presents in follow up with history of persistent low-grade dysplasia. Unable to visualize the cervical os due to scarring despite trial of estrogen alone and estrogen with subsequent progesterone withdrawal. She saw Dr. Laurette Schimke in consultation with her assessment as follows:  "Ms. Danielle Serrano is a 61 y.o. 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 Unable to visualize or palpate a cervix. Certainly the epitamy of an unsatisfactory colposcopy. I don't think that the cervix is large enough to safely facilitate a CKC.  I recommended to the patient and her friend that she undergo hysterectomy. Although not likely, If invasive disease is encountered then a parametrectomy and LND will have to be considered. We would be happy to see Danielle Serrano at that time to make additional recommendations.  The patient is aware that there is a 5% likelihood that she may develop vaginal dysplasia after the hysterectomy."  Exam today with [name] Asst. Abdomen soft nontender without masses guarding rebound organomegaly. Pelvic external BUS vagina with atrophic changes. Cervix not visualized difficult to palpate. Uterus small midline mobile nontender. Adnexa without masses or tenderness.  Assessment and plan: Persistent low-grade dysplasia inadequate colposcopy. As per Dr. Forrestine Him note feel hysterectomy is most prudent course. Patient agrees with this. She clearly understands she is at risk for vaginal dysplasia she does have a positive high risk HPV screen. A hysterectomy does not guarantee removal of the virus or persistent dysplasia. I think given the inability to clearly define the cervix that we should proceed with an abdominal hysterectomy to avoid damage to bladder/rectum. Issue of ovarian conservation was also reviewed and the possible benefit of early/postmenopausal continued hormone production as far as decrease cardiovascular risk and osteoporosis versus the risk of ovarian  disease in the future both benign and malignant was discussed at age 25 she prefer to have both ovaries removed which I think is certainly reasonable and we'll plan on a TAH/BSO. I discussed in general the intraoperative/postoperative courses and the risks to include infection hemorrhage transfusion damage to surrounding tissues such as bowel bladder ureters vessels and nerves requiring major reparative surgeries and we'll further discuss at her full preoperative consult appointment.

## 2012-01-31 ENCOUNTER — Telehealth: Payer: Self-pay | Admitting: Gynecology

## 2012-01-31 NOTE — Telephone Encounter (Signed)
I called patient and informed her surgery scheduled for Monday, Oct 7 7:30am at [facility].  Pre op consult schedule for Monday, Sept 20 10:00am with Dr. Velvet Bathe.

## 2012-02-01 ENCOUNTER — Encounter: Payer: Self-pay | Admitting: Gynecology

## 2012-02-01 NOTE — Progress Notes (Signed)
I spoke with Lelon Mast at [facility] and precertified TAH for 2 days approval #[id number].

## 2012-02-12 ENCOUNTER — Encounter (HOSPITAL_COMMUNITY): Payer: Self-pay | Admitting: Pharmacist

## 2012-02-19 ENCOUNTER — Encounter (HOSPITAL_COMMUNITY): Payer: Self-pay

## 2012-02-19 ENCOUNTER — Encounter (HOSPITAL_COMMUNITY)
Admission: RE | Admit: 2012-02-19 | Discharge: 2012-02-19 | Disposition: A | Discharge status: Home / Self Care | Payer: Managed Care, Other (non HMO) | Source: Ambulatory Visit | Attending: Gynecology | Admitting: Gynecology

## 2012-02-19 ENCOUNTER — Encounter: Payer: Self-pay | Admitting: Gynecology

## 2012-02-19 ENCOUNTER — Ambulatory Visit (INDEPENDENT_AMBULATORY_CARE_PROVIDER_SITE_OTHER): Payer: Managed Care, Other (non HMO) | Admitting: Gynecology

## 2012-02-19 DIAGNOSIS — IMO0002 Reserved for concepts with insufficient information to code with codable children: Secondary | ICD-10-CM

## 2012-02-19 DIAGNOSIS — B977 Papillomavirus as the cause of diseases classified elsewhere: Secondary | ICD-10-CM

## 2012-02-19 DIAGNOSIS — R87612 Low grade squamous intraepithelial lesion on cytologic smear of cervix (LGSIL): Secondary | ICD-10-CM

## 2012-02-19 LAB — SURGICAL PCR SCREEN
MRSA, PCR: NEGATIVE
Staphylococcus aureus: NEGATIVE

## 2012-02-19 LAB — CBC
HCT: 43.7 % (ref 36.0–46.0)
Hemoglobin: 14.5 g/dL (ref 12.0–15.0)
MCH: 28.8 pg (ref 26.0–34.0)
MCHC: 33.2 g/dL (ref 30.0–36.0)
MCV: 86.7 fL (ref 78.0–100.0)
Platelets: 235 10*3/uL (ref 150–400)
RBC: 5.04 MIL/uL (ref 3.87–5.11)
RDW: 13.1 % (ref 11.5–15.5)
WBC: 7.8 10*3/uL (ref 4.0–10.5)

## 2012-02-19 LAB — COMPREHENSIVE METABOLIC PANEL
ALT: 9 U/L (ref 0–35)
AST: 14 U/L (ref 0–37)
Albumin: 4.2 g/dL (ref 3.5–5.2)
Alkaline Phosphatase: 79 U/L (ref 39–117)
BUN: 12 mg/dL (ref 6–23)
CO2: 28 mEq/L (ref 19–32)
Calcium: 9.8 mg/dL (ref 8.4–10.5)
Chloride: 102 mEq/L (ref 96–112)
Creatinine, Ser: 0.74 mg/dL (ref 0.50–1.10)
GFR calc Af Amer: >90 mL/min (ref >90)
GFR calc non Af Amer: >90 mL/min (ref >90)
Glucose, Bld: 100 mg/dL — ABNORMAL HIGH (ref 70–99)
Potassium: 4.9 mEq/L (ref 3.5–5.1)
Sodium: 138 mEq/L (ref 135–145)
Total Bilirubin: 0.5 mg/dL (ref 0.3–1.2)
Total Protein: 7.3 g/dL (ref 6.0–8.3)

## 2012-02-19 NOTE — H&P (Signed)
Danielle Serrano [birth year] [id number]   History and Physical  Chief complaint: persistent low-grade cervical dysplasia, positive high risk HPV, inadequate colposcopy.  History of present illness: [name] with history of numerous normal Pap smears until March 2012 when her Pap smear showed low-grade SIL. High-risk HPV screen was positive. Patient underwent colposcopy which was inadequate where no transformation zone was seen. She had an ECC which was positive for low-grade SIL. She ultimately underwent a LEEP due to the positive ECC which showed low-grade SIL changes present at the ectocervical margin but the endocervical margin was negative. Follow up Pap smear in June 2013 showed ASCUS, positive high-risk HPV and on colposcopy her cervical os could not be detected. She was placed on vaginal estrogen cream times one month and on recolposcopy could not see a cervical os. She was placed on Elestrin pump twice daily with a progesterone withdrawal and again her colposcopy was in adequate with no cervical os visualized. She had a follow up consult with Dr. Laurette Schimke 01/25/2012 who recommended the patient undergo hysterectomy. Due to the cervical distortion it was felt not prudent to attempt LEEP/cone for fear of surrounding organ injury.  The patient is admitted for TAH/BSO.   Past medical history,surgical history, medications, allergies, family history and social history were all reviewed and documented in the EPIC chart. ROS:  Was performed and pertinent positives and negatives are included in the history of present illness.  Exam:  Higher education careers adviser General: well developed, well nourished female, no acute distress HEENT: normal  Lungs: clear to auscultation without wheezing, rales or rhonchi  Cardiac: regular rate without rubs, murmurs or gallops  Abdomen: soft, nontender without masses, guarding, rebound, organomegaly  Pelvic: external bus vagina: normal with mild atrophic  changes Cervix: palpable but unable to visualize clearly on speculum exam and no evidence of cervical os Uterus: normal size, midline and mobile, nontender  Adnexa: without masses or tenderness      Assessment/Plan:  61 y.o. G3P3 with above history for TAH/BSO. We discussed attempts at minimally invasive surgery such as TVH for laparoscopic assisted but given the inability to clearly identify the cervical margin it was felt prudent to proceed with abdominal hysterectomy to avoid bladder/rectal injury. The patient understands that this will mean a larger incision with a longer recovery and she is comfortable with this approach. I discussed the expected intraoperative and postoperative courses in the recovery period with her. The options for removing or conserving her ovaries were reviewed. The issues of ovarian cancer versus any remaining benefits of ovarian hormonal production from a cardiovascular overall mortality standpoint were reviewed in ultimately the patient desires BSO. She understands the possibility of hormone replacement therapy afterwards although is doing well at this point without hormones. The risks of surgery to include infection, prolonged antibiotics, abscess/hematoma development requiring reoperation and drainage were reviewed. Incisional complications requiring opening and draining of incisions, closure by secondary tension, long-term issues of cosmetics/keloid and hernia formation reviewed. The risk of hemorrhage necessitating transfusion and the risks of transfusion discussed include transfusion reaction hepatitis HIV mad cow disease and other unknown entities were reviewed with her. The risk of inadvertent injury to internal organs, either immediately recognized or delay recognized, including bowel bladder ureters vessels and nerves necessitating major exploratory reparative surgeries and future reparative surgeries including bowel resection bladder repair ureteral damage repair of  ostomy formation were reviewed with her. Given the cervical scarring and less than obvious cervical margin the anatomy of the situation  was reviewed and the realistic risk of bladder/sigmoid damage was discussed with her. Sexuality following hysterectomy was discussed in the risk of persistent orgasmic dysfunction and persistent dyspareunia was reviewed understood and accepted. Lastly she understands she is high risk HPV positive in that she does have a risk of persistent vaginal dysplasia after the hysterectomy requiring further evaluation and treatment. It is not a guarantee that she will never have dysplasia with the hysterectomy and she clearly understands and accepts this as being counseled both by myself and Dr. Nelly Rout. Patient's questions were answered to her satisfaction and she is ready to proceed with surgery.   Dara Lords MD, 4:50 PM 02/19/2012

## 2012-02-19 NOTE — Patient Instructions (Signed)
Followup for surgery as scheduled. 

## 2012-02-19 NOTE — Patient Instructions (Addendum)
   Your procedure is scheduled on: Monday, Oct 7th  Enter through the Main Entrance of [facility] at: 6 am Pick up the phone at the desk and dial [phone] and inform us of your arrival.  Please call this number if you have any problems the morning of surgery: [phone]  Remember: Do not eat food after midnight: Sunday Do not drink clear liquids after: Sunday Take these medicines the morning of surgery with a SIP OF WATER: prilosec, lexapro  Do not wear jewelry, make-up, or FINGER nail polish No metal in your hair or on your body. Do not wear lotions, powders, perfumes.  Please use your CHG wash as directed prior to surgery.  Do not shave anywhere for at least 12 hours prior to first CHG shower.  Do not bring valuables to the hospital. Contacts, dentures or bridgework may not be worn into surgery.  Leave suitcase in the car. After Surgery it may be brought to your room. For patients being admitted to the hospital, checkout time is 11:00am the day of discharge. Home with friend Donnetta Hutching.  Patients discharged on the day of surgery will not be allowed to drive home.

## 2012-02-19 NOTE — Progress Notes (Signed)
Danielle Serrano [birth year] [id number]   Preoperative consult  Chief complaint: persistent low-grade cervical dysplasia, positive high risk HPV, inadequate colposcopy.  History of present illness: [name] with history of numerous normal Pap smears until March 2012 when her Pap smear showed low-grade SIL. High-risk HPV screen was positive. Patient underwent colposcopy which was inadequate where no transformation zone was seen. She had an ECC which was positive for low-grade SIL. She ultimately underwent a LEEP due to the positive ECC which showed low-grade SIL changes present at the ectocervical margin but the endocervical margin was negative. Follow up Pap smear in June 2013 showed ASCUS, positive high-risk HPV and on colposcopy her cervical os could not be detected. She was placed on vaginal estrogen cream times one month and on recolposcopy could not see a cervical os. She was placed on Elestrin pump twice daily with a progesterone withdrawal and again her colposcopy was in adequate with no cervical os visualized. She had a follow up consult with Dr. Laurette Schimke 01/25/2012 who recommended the patient undergo hysterectomy. Due to the cervical distortion it was felt not prudent to attempt LEEP/cone for fear of surrounding organ injury.  The patient is admitted for TAH/BSO.   Past medical history,surgical history, medications, allergies, family history and social history were all reviewed and documented in the EPIC chart. ROS:  Was performed and pertinent positives and negatives are included in the history of present illness.  Exam:  Higher education careers adviser General: well developed, well nourished female, no acute distress HEENT: normal  Lungs: clear to auscultation without wheezing, rales or rhonchi  Cardiac: regular rate without rubs, murmurs or gallops  Abdomen: soft, nontender without masses, guarding, rebound, organomegaly  Pelvic: external bus vagina: normal with mild atrophic  changes Cervix: palpable but unable to visualize clearly on speculum exam and no evidence of cervical os Uterus: normal size, midline and mobile, nontender  Adnexa: without masses or tenderness      Assessment/Plan:  61 y.o. G3P3 with above history for TAH/BSO. We discussed attempts at minimally invasive surgery such as TVH for laparoscopic assisted but given the inability to clearly identify the cervical margin it was felt prudent to proceed with abdominal hysterectomy to avoid bladder/rectal injury. The patient understands that this will mean a larger incision with a longer recovery and she is comfortable with this approach. I discussed the expected intraoperative and postoperative courses in the recovery period with her. The options for removing or conserving her ovaries were reviewed. The issues of ovarian cancer versus any remaining benefits of ovarian hormonal production from a cardiovascular overall mortality standpoint were reviewed in ultimately the patient desires BSO. She understands the possibility of hormone replacement therapy afterwards although is doing well at this point without hormones. The risks of surgery to include infection, prolonged antibiotics, abscess/hematoma development requiring reoperation and drainage were reviewed. Incisional complications requiring opening and draining of incisions, closure by secondary tension, long-term issues of cosmetics/keloid and hernia formation reviewed. The risk of hemorrhage necessitating transfusion and the risks of transfusion discussed include transfusion reaction hepatitis HIV mad cow disease and other unknown entities were reviewed with her. The risk of inadvertent injury to internal organs, either immediately recognized or delay recognized, including bowel bladder ureters vessels and nerves necessitating major exploratory reparative surgeries and future reparative surgeries including bowel resection bladder repair ureteral damage repair of  ostomy formation were reviewed with her. Given the cervical scarring and less than obvious cervical margin the anatomy of the situation was  reviewed and the realistic risk of bladder/sigmoid damage was discussed with her. Sexuality following hysterectomy was discussed in the risk of persistent orgasmic dysfunction and persistent dyspareunia was reviewed understood and accepted. Lastly she understands she is high risk HPV positive in that she does have a risk of persistent vaginal dysplasia after the hysterectomy requiring further evaluation and treatment. It is not a guarantee that she will never have dysplasia with the hysterectomy and she clearly understands and accepts this as being counseled both by myself and Dr. Nelly Rout. Patient's questions were answered to her satisfaction and she is ready to proceed with surgery.   Dara Lords MD, 10:35 AM 02/19/2012

## 2012-02-20 HISTORY — PX: TOTAL ABDOMINAL HYSTERECTOMY W/ BILATERAL SALPINGOOPHORECTOMY: SHX83

## 2012-02-25 MED ORDER — GENTAMICIN SULFATE 40 MG/ML IJ SOLN
INTRAVENOUS | Status: AC
  Administered 2012-02-26: 313.2 mL via INTRAVENOUS
  Filled 2012-02-25: qty 7.83

## 2012-02-26 ENCOUNTER — Inpatient Hospital Stay (HOSPITAL_COMMUNITY)
Admission: RE | Admit: 2012-02-26 | Discharge: 2012-02-27 | DRG: 743 | Disposition: A | Discharge status: Home / Self Care | Payer: Managed Care, Other (non HMO) | Source: Ambulatory Visit | Attending: Gynecology | Admitting: Gynecology

## 2012-02-26 ENCOUNTER — Encounter (HOSPITAL_COMMUNITY): Payer: Self-pay | Admitting: Anesthesiology

## 2012-02-26 ENCOUNTER — Encounter (HOSPITAL_COMMUNITY)
Admission: RE | Disposition: A | Discharge status: Home / Self Care | Payer: Self-pay | Source: Ambulatory Visit | Attending: Gynecology

## 2012-02-26 ENCOUNTER — Encounter (HOSPITAL_COMMUNITY): Payer: Self-pay | Admitting: *Deleted

## 2012-02-26 ENCOUNTER — Ambulatory Visit (HOSPITAL_COMMUNITY): Payer: Managed Care, Other (non HMO) | Admitting: Anesthesiology

## 2012-02-26 DIAGNOSIS — N870 Mild cervical dysplasia: Principal | ICD-10-CM | POA: Diagnosis present

## 2012-02-26 DIAGNOSIS — Q513 Bicornate uterus: Secondary | ICD-10-CM

## 2012-02-26 DIAGNOSIS — N80109 Endometriosis of ovary, unspecified side, unspecified depth: Secondary | ICD-10-CM | POA: Diagnosis present

## 2012-02-26 DIAGNOSIS — D251 Intramural leiomyoma of uterus: Secondary | ICD-10-CM | POA: Diagnosis present

## 2012-02-26 DIAGNOSIS — N801 Endometriosis of ovary: Secondary | ICD-10-CM | POA: Diagnosis present

## 2012-02-26 DIAGNOSIS — R87810 Cervical high risk human papillomavirus (HPV) DNA test positive: Secondary | ICD-10-CM

## 2012-02-26 DIAGNOSIS — N871 Moderate cervical dysplasia: Secondary | ICD-10-CM

## 2012-02-26 SURGERY — HYSTERECTOMY, ABDOMINAL
Anesthesia: General | Site: Abdomen | Wound class: Clean Contaminated

## 2012-02-26 MED ORDER — NALOXONE HCL 0.4 MG/ML IJ SOLN: 0.4000 mg | INTRAMUSCULAR | Status: DC | PRN

## 2012-02-26 MED ORDER — ONDANSETRON HCL 4 MG/2ML IJ SOLN
INTRAMUSCULAR | Status: DC | PRN
  Administered 2012-02-26: 4 mg via INTRAVENOUS

## 2012-02-26 MED ORDER — MORPHINE SULFATE (PF) 1 MG/ML IV SOLN
INTRAVENOUS | Status: DC
  Administered 2012-02-26: 9 mg via INTRAVENOUS
  Administered 2012-02-26: 9.4 mg via INTRAVENOUS
  Administered 2012-02-26 (×2): via INTRAVENOUS
  Administered 2012-02-26: 9.9 mg via INTRAVENOUS
  Administered 2012-02-27 (×2): 3 mg via INTRAVENOUS
  Administered 2012-02-27: 6 mg via INTRAVENOUS
  Filled 2012-02-26 (×2): qty 25

## 2012-02-26 MED ORDER — HYDROMORPHONE HCL PF 1 MG/ML IJ SOLN
INTRAMUSCULAR | Status: DC | PRN
  Administered 2012-02-26 (×2): 0.5 mg via INTRAVENOUS

## 2012-02-26 MED ORDER — KETOROLAC TROMETHAMINE 30 MG/ML IJ SOLN
30.0000 mg | Freq: Four times a day (QID) | INTRAMUSCULAR | Status: AC
  Administered 2012-02-26 – 2012-02-27 (×4): 30 mg via INTRAVENOUS
  Filled 2012-02-26 (×3): qty 1

## 2012-02-26 MED ORDER — FENTANYL CITRATE 0.05 MG/ML IJ SOLN
INTRAMUSCULAR | Status: AC
  Filled 2012-02-26: qty 5

## 2012-02-26 MED ORDER — DIPHENHYDRAMINE HCL 50 MG/ML IJ SOLN: 12.5000 mg | Freq: Four times a day (QID) | INTRAMUSCULAR | Status: DC | PRN

## 2012-02-26 MED ORDER — KETOROLAC TROMETHAMINE 30 MG/ML IJ SOLN
INTRAMUSCULAR | Status: AC
  Filled 2012-02-26: qty 1

## 2012-02-26 MED ORDER — PROPOFOL 10 MG/ML IV EMUL
INTRAVENOUS | Status: DC | PRN
  Administered 2012-02-26: 160 mg via INTRAVENOUS

## 2012-02-26 MED ORDER — FENTANYL CITRATE 0.05 MG/ML IJ SOLN
INTRAMUSCULAR | Status: DC | PRN
  Administered 2012-02-26: 50 ug via INTRAVENOUS
  Administered 2012-02-26: 100 ug via INTRAVENOUS
  Administered 2012-02-26: 50 ug via INTRAVENOUS

## 2012-02-26 MED ORDER — ONDANSETRON HCL 4 MG/2ML IJ SOLN: 4.0000 mg | Freq: Four times a day (QID) | INTRAMUSCULAR | Status: DC | PRN

## 2012-02-26 MED ORDER — HYDROMORPHONE HCL PF 1 MG/ML IJ SOLN
INTRAMUSCULAR | Status: AC
  Filled 2012-02-26: qty 1

## 2012-02-26 MED ORDER — ONDANSETRON HCL 4 MG/2ML IJ SOLN
INTRAMUSCULAR | Status: AC
  Filled 2012-02-26: qty 2

## 2012-02-26 MED ORDER — MIDAZOLAM HCL 2 MG/2ML IJ SOLN
INTRAMUSCULAR | Status: AC
  Filled 2012-02-26: qty 2

## 2012-02-26 MED ORDER — DIPHENHYDRAMINE HCL 12.5 MG/5ML PO ELIX
12.5000 mg | ORAL_SOLUTION | Freq: Four times a day (QID) | ORAL | Status: DC | PRN
  Administered 2012-02-26 – 2012-02-27 (×2): 12.5 mg via ORAL
  Filled 2012-02-26 (×2): qty 5

## 2012-02-26 MED ORDER — HYDROMORPHONE HCL PF 1 MG/ML IJ SOLN
0.2500 mg | INTRAMUSCULAR | Status: DC | PRN
  Administered 2012-02-26 (×3): 0.5 mg via INTRAVENOUS

## 2012-02-26 MED ORDER — GLYCOPYRROLATE 0.2 MG/ML IJ SOLN
INTRAMUSCULAR | Status: AC
  Filled 2012-02-26: qty 2

## 2012-02-26 MED ORDER — ESCITALOPRAM OXALATE 10 MG PO TABS
10.0000 mg | ORAL_TABLET | Freq: Every day | ORAL | Status: DC
  Administered 2012-02-26 – 2012-02-27 (×2): 10 mg via ORAL
  Filled 2012-02-26 (×3): qty 1

## 2012-02-26 MED ORDER — PANTOPRAZOLE SODIUM 40 MG PO TBEC
40.0000 mg | DELAYED_RELEASE_TABLET | Freq: Once | ORAL | Status: AC
  Administered 2012-02-26: 40 mg via ORAL

## 2012-02-26 MED ORDER — PANTOPRAZOLE SODIUM 40 MG PO TBEC
DELAYED_RELEASE_TABLET | ORAL | Status: AC
  Administered 2012-02-26: 40 mg via ORAL
  Filled 2012-02-26: qty 1

## 2012-02-26 MED ORDER — LACTATED RINGERS IV SOLN
INTRAVENOUS | Status: DC
  Administered 2012-02-26 (×2): via INTRAVENOUS

## 2012-02-26 MED ORDER — SODIUM CHLORIDE 0.9 % IJ SOLN: 9.0000 mL | INTRAMUSCULAR | Status: DC | PRN

## 2012-02-26 MED ORDER — GLYCOPYRROLATE 0.2 MG/ML IJ SOLN
INTRAMUSCULAR | Status: DC | PRN
  Administered 2012-02-26: 0.2 mg via INTRAVENOUS
  Administered 2012-02-26: 0.4 mg via INTRAVENOUS

## 2012-02-26 MED ORDER — LIDOCAINE HCL (CARDIAC) 20 MG/ML IV SOLN
INTRAVENOUS | Status: AC
  Filled 2012-02-26: qty 5

## 2012-02-26 MED ORDER — NEOSTIGMINE METHYLSULFATE 1 MG/ML IJ SOLN
INTRAMUSCULAR | Status: AC
  Filled 2012-02-26: qty 10

## 2012-02-26 MED ORDER — DEXAMETHASONE SODIUM PHOSPHATE 4 MG/ML IJ SOLN
INTRAMUSCULAR | Status: DC | PRN
  Administered 2012-02-26: 10 mg via INTRAVENOUS

## 2012-02-26 MED ORDER — ROCURONIUM BROMIDE 50 MG/5ML IV SOLN
INTRAVENOUS | Status: AC
  Filled 2012-02-26: qty 1

## 2012-02-26 MED ORDER — NEOSTIGMINE METHYLSULFATE 1 MG/ML IJ SOLN
INTRAMUSCULAR | Status: DC | PRN
  Administered 2012-02-26: 4 mg via INTRAVENOUS

## 2012-02-26 MED ORDER — DEXTROSE-NACL 5-0.9 % IV SOLN
INTRAVENOUS | Status: DC
  Administered 2012-02-26 – 2012-02-27 (×3): via INTRAVENOUS

## 2012-02-26 MED ORDER — DIPHENHYDRAMINE HCL 12.5 MG/5ML PO ELIX
12.5000 mg | ORAL_SOLUTION | Freq: Four times a day (QID) | ORAL | Status: DC | PRN
  Filled 2012-02-26: qty 5

## 2012-02-26 MED ORDER — LIDOCAINE HCL (CARDIAC) 20 MG/ML IV SOLN
INTRAVENOUS | Status: DC | PRN
  Administered 2012-02-26: 50 mg via INTRAVENOUS
  Administered 2012-02-26: 20 mg via INTRAVENOUS
  Administered 2012-02-26: 30 mg via INTRAVENOUS

## 2012-02-26 MED ORDER — ROCURONIUM BROMIDE 100 MG/10ML IV SOLN
INTRAVENOUS | Status: DC | PRN
  Administered 2012-02-26: 40 mg via INTRAVENOUS

## 2012-02-26 MED ORDER — KETOROLAC TROMETHAMINE 30 MG/ML IJ SOLN: 15.0000 mg | Freq: Once | INTRAMUSCULAR | Status: DC | PRN

## 2012-02-26 MED ORDER — HYDROMORPHONE HCL PF 1 MG/ML IJ SOLN
INTRAMUSCULAR | Status: AC
  Administered 2012-02-26: 0.5 mg via INTRAVENOUS
  Filled 2012-02-26: qty 1

## 2012-02-26 MED ORDER — BUPIVACAINE LIPOSOME 1.3 % IJ SUSP
20.0000 mL | Freq: Once | INTRAMUSCULAR | Status: AC
  Administered 2012-02-26: 20 mL
  Filled 2012-02-26: qty 20

## 2012-02-26 MED ORDER — DEXAMETHASONE SODIUM PHOSPHATE 10 MG/ML IJ SOLN
INTRAMUSCULAR | Status: AC
  Filled 2012-02-26: qty 1

## 2012-02-26 MED ORDER — DEXTROSE IN LACTATED RINGERS 5 % IV SOLN: INTRAVENOUS | Status: DC

## 2012-02-26 MED ORDER — DIPHENHYDRAMINE HCL 50 MG/ML IJ SOLN
12.5000 mg | Freq: Four times a day (QID) | INTRAMUSCULAR | Status: DC | PRN
  Administered 2012-02-26: 12:00:00 via INTRAVENOUS
  Filled 2012-02-26: qty 1

## 2012-02-26 MED ORDER — KETOROLAC TROMETHAMINE 30 MG/ML IJ SOLN
INTRAMUSCULAR | Status: DC | PRN
  Administered 2012-02-26: 30 mg via INTRAVENOUS

## 2012-02-26 MED ORDER — PROPOFOL 10 MG/ML IV EMUL
INTRAVENOUS | Status: AC
  Filled 2012-02-26: qty 20

## 2012-02-26 MED ORDER — GLYCOPYRROLATE 0.2 MG/ML IJ SOLN
INTRAMUSCULAR | Status: AC
  Filled 2012-02-26: qty 1

## 2012-02-26 MED ORDER — MIDAZOLAM HCL 5 MG/5ML IJ SOLN
INTRAMUSCULAR | Status: DC | PRN
  Administered 2012-02-26: 1 mg via INTRAVENOUS

## 2012-02-26 MED ORDER — CLINDAMYCIN PHOSPHATE 900 MG/50ML IV SOLN
INTRAVENOUS | Status: DC | PRN
  Administered 2012-02-26: 900 mg via INTRAVENOUS

## 2012-02-26 SURGICAL SUPPLY — 27 items
BENZOIN TINCTURE PRP APPL 2/3 (GAUZE/BANDAGES/DRESSINGS) ×3 IMPLANT
CANISTER SUCTION 2500CC (MISCELLANEOUS) ×3 IMPLANT
CLOTH BEACON ORANGE TIMEOUT ST (SAFETY) ×3 IMPLANT
CONT PATH 16OZ SNAP LID 3702 (MISCELLANEOUS) ×3 IMPLANT
DECANTER SPIKE VIAL GLASS SM (MISCELLANEOUS) ×3 IMPLANT
DRSG COVADERM 4X10 (GAUZE/BANDAGES/DRESSINGS) ×3 IMPLANT
GLOVE BIO SURGEON STRL SZ7.5 (GLOVE) ×6 IMPLANT
GOWN STRL NON-REIN LRG LVL3 (GOWN DISPOSABLE) ×3 IMPLANT
GOWN STRL REIN XL XLG (GOWN DISPOSABLE) ×3 IMPLANT
NEEDLE HYPO 25X1 1.5 SAFETY (NEEDLE) ×3 IMPLANT
NS IRRIG 1000ML POUR BTL (IV SOLUTION) ×3 IMPLANT
PACK ABDOMINAL GYN (CUSTOM PROCEDURE TRAY) ×3 IMPLANT
PAD OB MATERNITY 4.3X12.25 (PERSONAL CARE ITEMS) ×3 IMPLANT
SPONGE LAP 18X18 X RAY DECT (DISPOSABLE) ×3 IMPLANT
STRIP CLOSURE SKIN 1/2X4 (GAUZE/BANDAGES/DRESSINGS) ×3 IMPLANT
SUT VIC AB 0 CT1 18XCR BRD8 (SUTURE) ×6 IMPLANT
SUT VIC AB 0 CT1 27 (SUTURE) ×3
SUT VIC AB 0 CT1 27XBRD ANBCTR (SUTURE) ×6 IMPLANT
SUT VIC AB 0 CT1 8-18 (SUTURE) ×3
SUT VIC AB 2-0 CT1 27 (SUTURE) ×1
SUT VIC AB 2-0 CT1 TAPERPNT 27 (SUTURE) ×2 IMPLANT
SUT VIC AB 4-0 KS 27 (SUTURE) ×3 IMPLANT
SUT VICRYL 0 TIES 12 18 (SUTURE) ×3 IMPLANT
SYR CONTROL 10ML LL (SYRINGE) ×3 IMPLANT
TOWEL OR 17X24 6PK STRL BLUE (TOWEL DISPOSABLE) ×6 IMPLANT
TRAY FOLEY CATH 14FR (SET/KITS/TRAYS/PACK) ×3 IMPLANT
WATER STERILE IRR 1000ML POUR (IV SOLUTION) ×3 IMPLANT

## 2012-02-26 NOTE — Progress Notes (Signed)
DOS S/P TAH, BSO  Awake, alert doing well with good pain relief. Abd soft, minimal tenderness, dressing dry Urine clear yellow in foley  Results of surgery reviewed.  Routine PO care, possible D/C tomorrow.

## 2012-02-26 NOTE — Addendum Note (Signed)
Addendum  created 02/26/12 1618 by Algis Greenhouse, CRNA   Modules edited:Notes Section

## 2012-02-26 NOTE — H&P (Signed)
  The patient was examined.  I reviewed the proposed surgery and consent form with the patient.  The dictated history and physical is current and accurate and all questions were answered. The patient is ready to proceed with surgery and has a realistic understanding and expectation for the outcome.   Dara Lords MD, 7:09 AM 02/26/2012

## 2012-02-26 NOTE — Progress Notes (Signed)
Pt states pain is a 7/10 at present, resting quietly and snoring at times, resp rate 10, sats 95% on 2L

## 2012-02-26 NOTE — Transfer of Care (Signed)
Immediate Anesthesia Transfer of Care Note  Patient: Danielle Serrano  Procedure(s) Performed: Procedure(s) (LRB) with comments: HYSTERECTOMY ABDOMINAL (N/A) -       SALPINGO OOPHERECTOMY (Bilateral)  Patient Location: PACU  Anesthesia Type: General  Level of Consciousness: awake, sedated, patient cooperative and lethargic  Airway & Oxygen Therapy: Patient Spontanous Breathing and Patient connected to nasal cannula oxygen  Post-op Assessment: Report given to PACU RN  Post vital signs: Reviewed and stable  Complications: No apparent anesthesia complications

## 2012-02-26 NOTE — Anesthesia Postprocedure Evaluation (Signed)
Anesthesia Post Note  Patient: Danielle Serrano  Procedure(s) Performed: Procedure(s) (LRB): HYSTERECTOMY ABDOMINAL (N/A) SALPINGO OOPHERECTOMY (Bilateral)  Anesthesia type: General  Patient location: PACU  Post pain: Pain level controlled  Post assessment: Post-op Vital signs reviewed  Last Vitals:  Filed Vitals:   02/26/12 0915  BP: 109/52  Pulse: 78  Temp:   Resp: 12    Post vital signs: Reviewed  Level of consciousness: sedated  Complications: No apparent anesthesia complicationsfj

## 2012-02-26 NOTE — Anesthesia Postprocedure Evaluation (Signed)
  Anesthesia Post-op Note  Patient: Danielle Serrano  Procedure(s) Performed: Procedure(s) (LRB) with comments: HYSTERECTOMY ABDOMINAL (N/A) -       SALPINGO OOPHERECTOMY (Bilateral)  Patient Location: Mother/Baby  Anesthesia Type: General  Level of Consciousness: awake  Airway and Oxygen Therapy: Patient Spontanous Breathing and Patient connected to nasal cannula oxygen  Post-op Pain: mild  Post-op Assessment: Post-op Vital signs reviewed  Post-op Vital Signs: Reviewed and stable  Complications: No apparent anesthesia complications

## 2012-02-26 NOTE — Op Note (Signed)
Danielle Serrano 27-Sep-1950 [id number]   Post Operative Note   Date of surgery:  02/26/2012  Pre Op Dx:  Persistent cervical dysplasia   Post Op Dx:  Persistent cervical dysplasia. Bicornuate uterus.  Procedure:  Total abdominal hysterectomy, bilateral salpingo-oophorectomy  Surgeon:  Dara Lords  Assistant:  Reynaldo Minium  Anesthesia:  General  EBL:  50 cc  Complications:  None  Specimen:  Uterus, bilateral ovaries and fallopian tubes to pathology  Findings: EUA:  External BUS vagina normal. Cervix not visualized. Uterus normal size midline mobile. Adnexa without masses.   Operative:  Uterus with heart-shaped contour consistent with mild bicornuate uterus, otherwise normal. Ovaries grossly normal bilaterally, postmenopausal in appearance. Fallopian tube segments consistent with prior tubal sterilization otherwise normal in appearance. Pelvis without evidence of adhesive disease or endometriosis. Upper abdominal exam with liver smooth no palpable abnormalities. Appendix grossly normal.  Procedure:  The patient was taken to the operating room, underwent general anesthesia, received abdominal/perineal/vaginal preparation with Betadine solution, EUA performed and a Foley catheter was placed in sterile technique. A time out was performed by the surgical team. The patient was draped in the usual fashion. The abdomen was sharply entered through a Pfannenstiel incision, achieving adequate hemostasis at all levels. A Balfour retractor and bladder blade replaced and the intestines were packed from the operative field. The uterus was elevated with Kelly clamps and the right and left round ligaments were transected with electrocautery. The vesicouterine perineal fold was sharply incised at the bladder flap was sharply developed without difficulty.  The right infundibulopelvic ligament and vessels were identified and skeletonized and doubly clamped cut and doubly ligated using 0 Vicryl suture.   The ureter was identified away from the operative site. A similar procedure was carried out on the other side. The right uterine vessels were skeletonized at the level of the cervico uterine junction and were clamped cut and doubly ligated using 0 Vicryl suture. A similar procedure carried out on the other side.  The bladder flap was progressively developed to the level below the palpable cervix and the uterus was progressively freed from its attachments to clamping, cutting and ligating of the parametrial/paracervical tissues. The upper vagina was then crossclamped and the specimen was excised and sent sent to pathology noting on the request persistent cervical dysplasia and requesting a cone processing of the cervix.  The upper vagina was inspected to assure complete removal of cervical tissue and subsequently right and left angle sutures were placed using 0 Vicryl suture. The intervening vagina was closed anterior to posterior using 0 Vicryl suture in interrupted figure-of-eight stitch. The pelvis was irrigate showing adequate hemostasis and the bowel packing was removed, the bowel for retractor and bladder blade removed and the anterior fascia reapproximated using 0 Vicryl suture in a running stitch starting at the angles and meeting in the middle.  The subcutaneous tissues were irrigated, hemostasis achieved with electrocautery and the skin was reapproximated using 4-0 Vicryl in a running subcuticular stitch. Exparel was injected into the subcutaneous tissues surrounding the incision, benzoin and Steri-Strips applied and a sterile dressing was applied. The patient received intraoperative Toradol, was awakened without difficulty and was taken to the recovery room in good condition having tolerated the procedure well.    Dara Lords MD, 9:10 AM 02/26/2012

## 2012-02-26 NOTE — Anesthesia Preprocedure Evaluation (Signed)
Anesthesia Evaluation  Patient identified by MRN, date of birth, ID band Patient awake    Reviewed: Allergy & Precautions, H&P , NPO status , Patient's Chart, lab work & pertinent test results, reviewed documented beta blocker date and time   History of Anesthesia Complications Negative for: history of anesthetic complications  Airway Mallampati: II TM Distance: >3 FB Neck ROM: full    Dental  (+) Teeth Intact   Pulmonary Current Smoker (1 ppd),  breath sounds clear to auscultation  Pulmonary exam normal       Cardiovascular Exercise Tolerance: Good negative cardio ROS  Rhythm:regular Rate:Normal     Neuro/Psych PSYCHIATRIC DISORDERS (depression) negative neurological ROS     GI/Hepatic Neg liver ROS, GERD- (occasional OTC med)  Medicated,  Endo/Other  negative endocrine ROS  Renal/GU negative Renal ROS  Female GU complaint     Musculoskeletal   Abdominal   Peds  Hematology negative hematology ROS (+)   Anesthesia Other Findings   Reproductive/Obstetrics negative OB ROS                           Anesthesia Physical Anesthesia Plan  ASA: II  Anesthesia Plan: General ETT   Post-op Pain Management:    Induction:   Airway Management Planned:   Additional Equipment:   Intra-op Plan:   Post-operative Plan:   Informed Consent: I have reviewed the patients History and Physical, chart, labs and discussed the procedure including the risks, benefits and alternatives for the proposed anesthesia with the patient or authorized representative who has indicated his/her understanding and acceptance.   Dental Advisory Given  Plan Discussed with: CRNA and Surgeon  Anesthesia Plan Comments:         Anesthesia Quick Evaluation

## 2012-02-27 ENCOUNTER — Encounter (HOSPITAL_COMMUNITY): Payer: Self-pay | Admitting: Gynecology

## 2012-02-27 MED ORDER — HYDROCODONE-ACETAMINOPHEN 5-325 MG PO TABS
1.0000 | ORAL_TABLET | ORAL | Status: DC | PRN
Start: 2012-02-27 — End: 2012-02-27
  Administered 2012-02-27 (×2): 2 via ORAL
  Filled 2012-02-27 (×2): qty 2

## 2012-02-27 MED ORDER — DIPHENHYDRAMINE HCL 25 MG PO CAPS
50.0000 mg | ORAL_CAPSULE | Freq: Four times a day (QID) | ORAL | Status: DC | PRN
  Administered 2012-02-27: 50 mg via ORAL
  Filled 2012-02-27: qty 2

## 2012-02-27 MED ORDER — OXYCODONE-ACETAMINOPHEN 5-325 MG PO TABS
2.0000 | ORAL_TABLET | Freq: Four times a day (QID) | ORAL | Status: DC | PRN
  Administered 2012-02-27: 2 via ORAL
  Filled 2012-02-27: qty 2

## 2012-02-27 MED ORDER — HYDROCODONE-ACETAMINOPHEN 5-325 MG PO TABS: 1.0000 | ORAL_TABLET | ORAL | Status: DC | PRN

## 2012-02-27 NOTE — Progress Notes (Signed)
Pt discharged to home with friend.  Condition stable.  Pt to car via wheelchair with RN.  No equipment for home ordered at discharge.

## 2012-02-27 NOTE — Progress Notes (Signed)
Danielle Serrano 03-01-1951 [id number]   1 Day Post-Op Procedure(s) (LRB): HYSTERECTOMY ABDOMINAL (N/A) SALPINGO OOPHERECTOMY (Bilateral)  Subjective: Patient reports feels well, no complaints, pain severity reported mild, yes taking PO, foley catheter out, yes voiding, yes ambulating, yes passing flatus  Objective: Afeb, VSS   EXAM General: awake, no distress GI: soft, non tender, bowel sounds active, incision intact Lower Extremities: Without swelling or tenderness Vaginal Bleeding: Reported scant  Assessment: s/p Procedure(s): HYSTERECTOMY ABDOMINAL SALPINGO OOPHERECTOMY: progressing well, ready for discharge.    Plan: Discharge home today.  Precautions, instructions and follow up were discussed with the patient.  Pathology reviewed which showed leiomyomata and no dysplasia.  Additional cuts requested.  Prescriptions provided per AVS.  Patient to call the office to arrange a post-operative appointmant in 2 weeks.  Dara Lords, MD 02/27/2012 5:38 PM

## 2012-02-27 NOTE — Progress Notes (Signed)
UR Chart review completed.  

## 2012-02-27 NOTE — Progress Notes (Signed)
Patient ID: Danielle Serrano, female   DOB: 1951-01-16, 61 y.o.   MRN: [id number] PEACE NOYES 01-Jun-1950 [id number]   1 Day Post-Op s/p Procedure(s): HYSTERECTOMY ABDOMINAL SALPINGO OOPHERECTOMY  Subjective: Patient reports feels well, no complaints, no acute distress, pain severity reported mild, yes taking PO, foley catheter out, novoiding, yes ambulating, yespassing flatus  Objective: Afeb, VSS   EXAM General: awake, no distress Resp: rhonchi clear to auscultation bilaterally Cardio: regular rate and rhythm, S1, S2 normal, no murmur, click, rub or gallop GI: soft, non tender, bowel sounds active, incisions dry intact Lower Extremities: Without swelling or tenderness Vaginal Bleeding: Reported absent  Assessment: s/p Procedure(s): HYSTERECTOMY ABDOMINAL SALPINGO OOPHERECTOMY: stable and progressing well  Plan: Continue routine post operative care, Advance diet Encourage ambulation Advance to PO medication Discontinue IV fluids.  No PO Hb done due to minimal blood loss.  Reassess latter today to consider discharge.  LOS: 1 day    Dara Lords, MD 02/27/2012 7:21 AM

## 2012-02-28 NOTE — Discharge Summary (Signed)
  Danielle Serrano 1951-04-28 [id number]   Discharge Summary  Date of Admission:  02/26/2012  Date of Discharge:  02/27/2012  Discharge Diagnosis:  Persistent cervical dysplasia, leiomyoma  Procedure:  Procedure(s): HYSTERECTOMY ABDOMINAL SALPINGO OOPHERECTOMY  Pathology: Accession #: [id number] Diagnosis Uterus, ovaries and fallopian tubes - LEIOMYOMATA. - ENDOMETRIUM: BENIGN WEAKLY PROLIFERATIVE ENDOMETRIUM, NO ATYPIA, HYPERPLASIA OR MALIGNANCY. - CERVIX: BENIGN SQUAMOUS MUCOSA AND ENDOCERVICAL MUCOSA, NO DYSPLASIA OR MALIGNANCY. - FALLOPIAN TUBES: PREVIOUSLY LIGATED FALLOPIAN TUBAL TISSUE, NO EVIDENCE OF ENDOMETRIOSIS, ATYPIA OR MALIGNANCY. - BILATERAL OVARIES: BENIGN OVARIAN TISSUE WITH ENDOMETRIOSIS, NO ATYPIA OR MALIGNANCY.  Hospital Course:  Patient underwent uncomplicated TAH BSO 02/26/2012. She was discharged on postoperative day #1 ambulate well, tolerating a regular diet, voiding without difficulty, with good pain relief.  Final pathology did not show cervical dysplasia and the pathologist is in the process of reprocessing the cervix with additional slides, the results of which are pending. The patient received precautions, instructions and follow up and will be seen in the office in 2 weeks postoperative. She received a prescription for Vicodin 5.0/325 #30 one to 2 by mouth Q4 to 6 hours when necessary pain.    Dara Lords MD, 11:06 AM 02/28/2012

## 2012-03-06 ENCOUNTER — Other Ambulatory Visit: Payer: Self-pay | Admitting: Gynecology

## 2012-03-06 MED ORDER — HYDROCODONE-ACETAMINOPHEN 5-325 MG PO TABS: 1.0000 | ORAL_TABLET | ORAL | Status: DC | PRN

## 2012-03-11 ENCOUNTER — Ambulatory Visit (INDEPENDENT_AMBULATORY_CARE_PROVIDER_SITE_OTHER): Payer: Managed Care, Other (non HMO) | Admitting: Gynecology

## 2012-03-11 ENCOUNTER — Encounter: Payer: Self-pay | Admitting: Gynecology

## 2012-03-11 DIAGNOSIS — Z9889 Other specified postprocedural states: Secondary | ICD-10-CM

## 2012-03-11 NOTE — Patient Instructions (Signed)
Slowly resume normal activities with the exception of continued pelvic rest. Follow up in 2 weeks for postop check

## 2012-03-11 NOTE — Progress Notes (Signed)
Patient presents for postoperative visit 2 weeks status post TAH/BSO or persistent low-grade cervical dysplasia. Final pathology did not show a source of dysplasia. I reviewed pathology findings with the patient. She is doing well postoperative without complaints.  Exam with [name] assistant Abdomen soft nontender without masses guarding rebound organomegaly. Incisions healing nicely. Pelvic external BUS vagina normal with cuff healing nicely. Bimanual without masses or tenderness.  Assessment and plan: 2 weeks status post TAH/BSO doing well. We'll slowly resume activities with the exception of pelvic rest. Represent in 2 weeks for postop checkup.

## 2012-03-25 ENCOUNTER — Ambulatory Visit (INDEPENDENT_AMBULATORY_CARE_PROVIDER_SITE_OTHER): Payer: Managed Care, Other (non HMO) | Admitting: Gynecology

## 2012-03-25 ENCOUNTER — Encounter: Payer: Self-pay | Admitting: Gynecology

## 2012-03-25 DIAGNOSIS — Z09 Encounter for follow-up examination after completed treatment for conditions other than malignant neoplasm: Secondary | ICD-10-CM

## 2012-03-25 NOTE — Progress Notes (Signed)
Patient presents one month postop status post TAH/BSO for persistent dysplasia. Final pathology did not show a source of the dysplasia. She's done well postoperative and is ready to return to work.  Exam was kim assistant Abdomen soft nontender incision healed nicely. No masses guarding rebound. Pelvic external BUS vagina normal. Cuff healed nicely. Bimanual without masses or tenderness.  Assessment and plan: One month status post TAH/BSO doing well. Resume normal activities. Follow up June 2014 for her annual exam when due.

## 2012-03-25 NOTE — Patient Instructions (Signed)
Follow up in June 2014 for annual exam

## 2012-03-26 ENCOUNTER — Telehealth: Payer: Self-pay | Admitting: *Deleted

## 2012-03-26 NOTE — Telephone Encounter (Signed)
Okay for colonoscopy in December

## 2012-03-26 NOTE — Telephone Encounter (Signed)
Spoke with amanda and told her the below note.

## 2012-03-26 NOTE — Telephone Encounter (Signed)
Dr.James Edwards at [facility] would like medical clearance if okay to proceed with colonoscopy in December pt will need to schedule. Pt is post TAH/BSO for persistent dysplasia on 02/26/12. Please advise

## 2012-07-08 ENCOUNTER — Other Ambulatory Visit: Payer: Self-pay | Admitting: Family Medicine

## 2013-12-19 ENCOUNTER — Encounter: Payer: Self-pay | Admitting: *Deleted

## 2014-03-23 ENCOUNTER — Encounter: Payer: Self-pay | Admitting: *Deleted

## 2015-05-26 ENCOUNTER — Encounter (HOSPITAL_COMMUNITY): Payer: Self-pay | Admitting: Emergency Medicine

## 2015-05-26 ENCOUNTER — Emergency Department (HOSPITAL_COMMUNITY)
Admission: EM | Admit: 2015-05-26 | Discharge: 2015-05-26 | Disposition: A | Discharge status: Home / Self Care | Payer: Managed Care, Other (non HMO) | Attending: Emergency Medicine | Admitting: Emergency Medicine

## 2015-05-26 ENCOUNTER — Emergency Department (HOSPITAL_COMMUNITY): Payer: Managed Care, Other (non HMO)

## 2015-05-26 DIAGNOSIS — Z8659 Personal history of other mental and behavioral disorders: Secondary | ICD-10-CM | POA: Insufficient documentation

## 2015-05-26 DIAGNOSIS — M659 Synovitis and tenosynovitis, unspecified: Secondary | ICD-10-CM | POA: Insufficient documentation

## 2015-05-26 DIAGNOSIS — Z880 Allergy status to penicillin: Secondary | ICD-10-CM | POA: Insufficient documentation

## 2015-05-26 DIAGNOSIS — F17210 Nicotine dependence, cigarettes, uncomplicated: Secondary | ICD-10-CM | POA: Insufficient documentation

## 2015-05-26 DIAGNOSIS — K219 Gastro-esophageal reflux disease without esophagitis: Secondary | ICD-10-CM | POA: Insufficient documentation

## 2015-05-26 DIAGNOSIS — M778 Other enthesopathies, not elsewhere classified: Secondary | ICD-10-CM

## 2015-05-26 IMAGING — CR DG WRIST COMPLETE 3+V*R*
4 series · 4 of 4 positions shown · non-contrast
Comparison: None.

CLINICAL DATA: Worsening right wrist pain.

EXAM:
RIGHT WRIST - COMPLETE 3+ VIEW

[x wrist pa right]
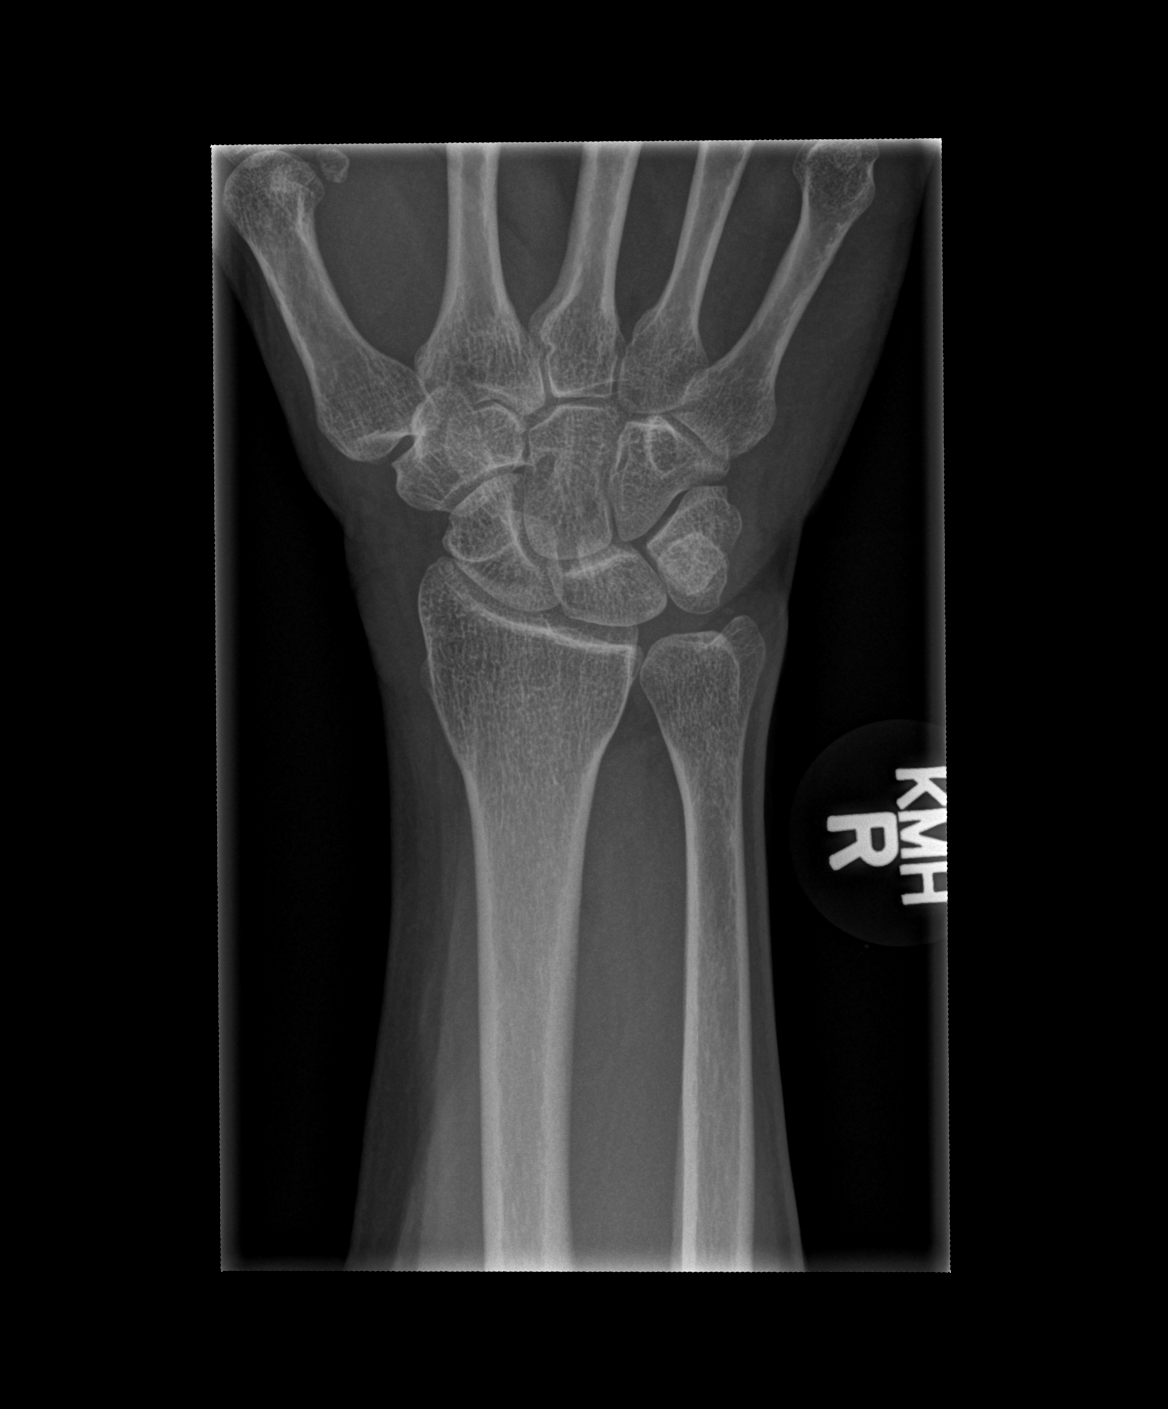

[x wrist obl right]
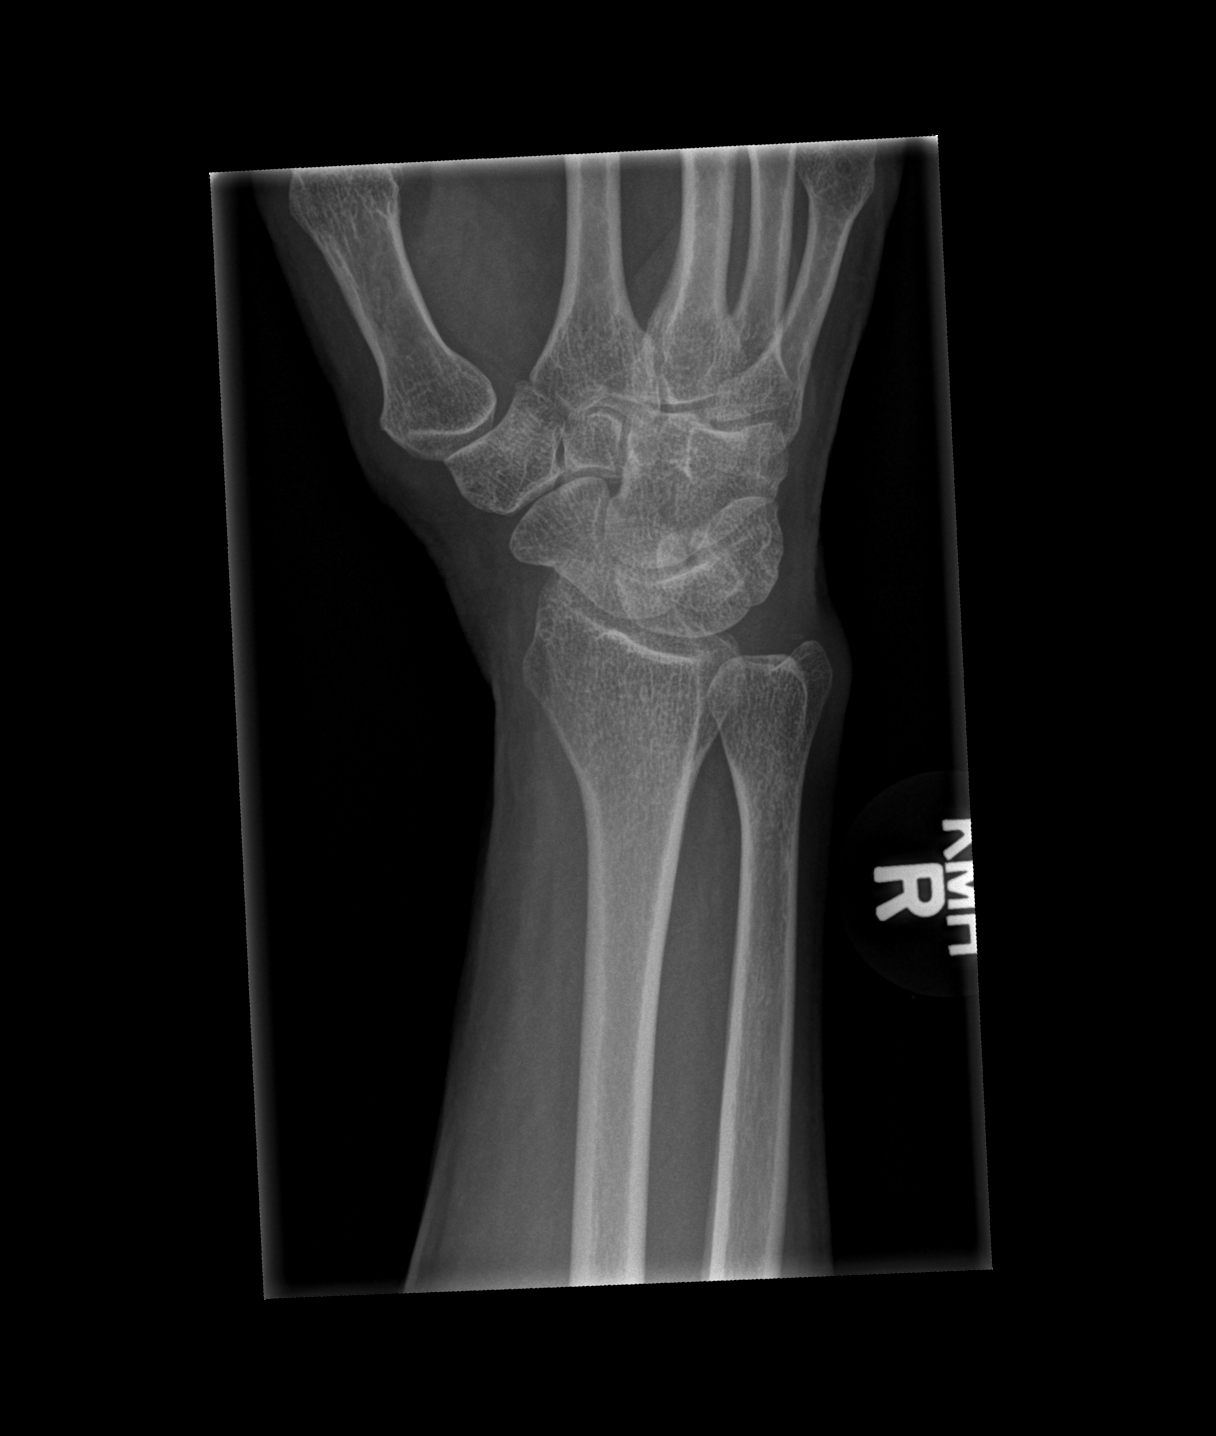

[x wrist lat right]
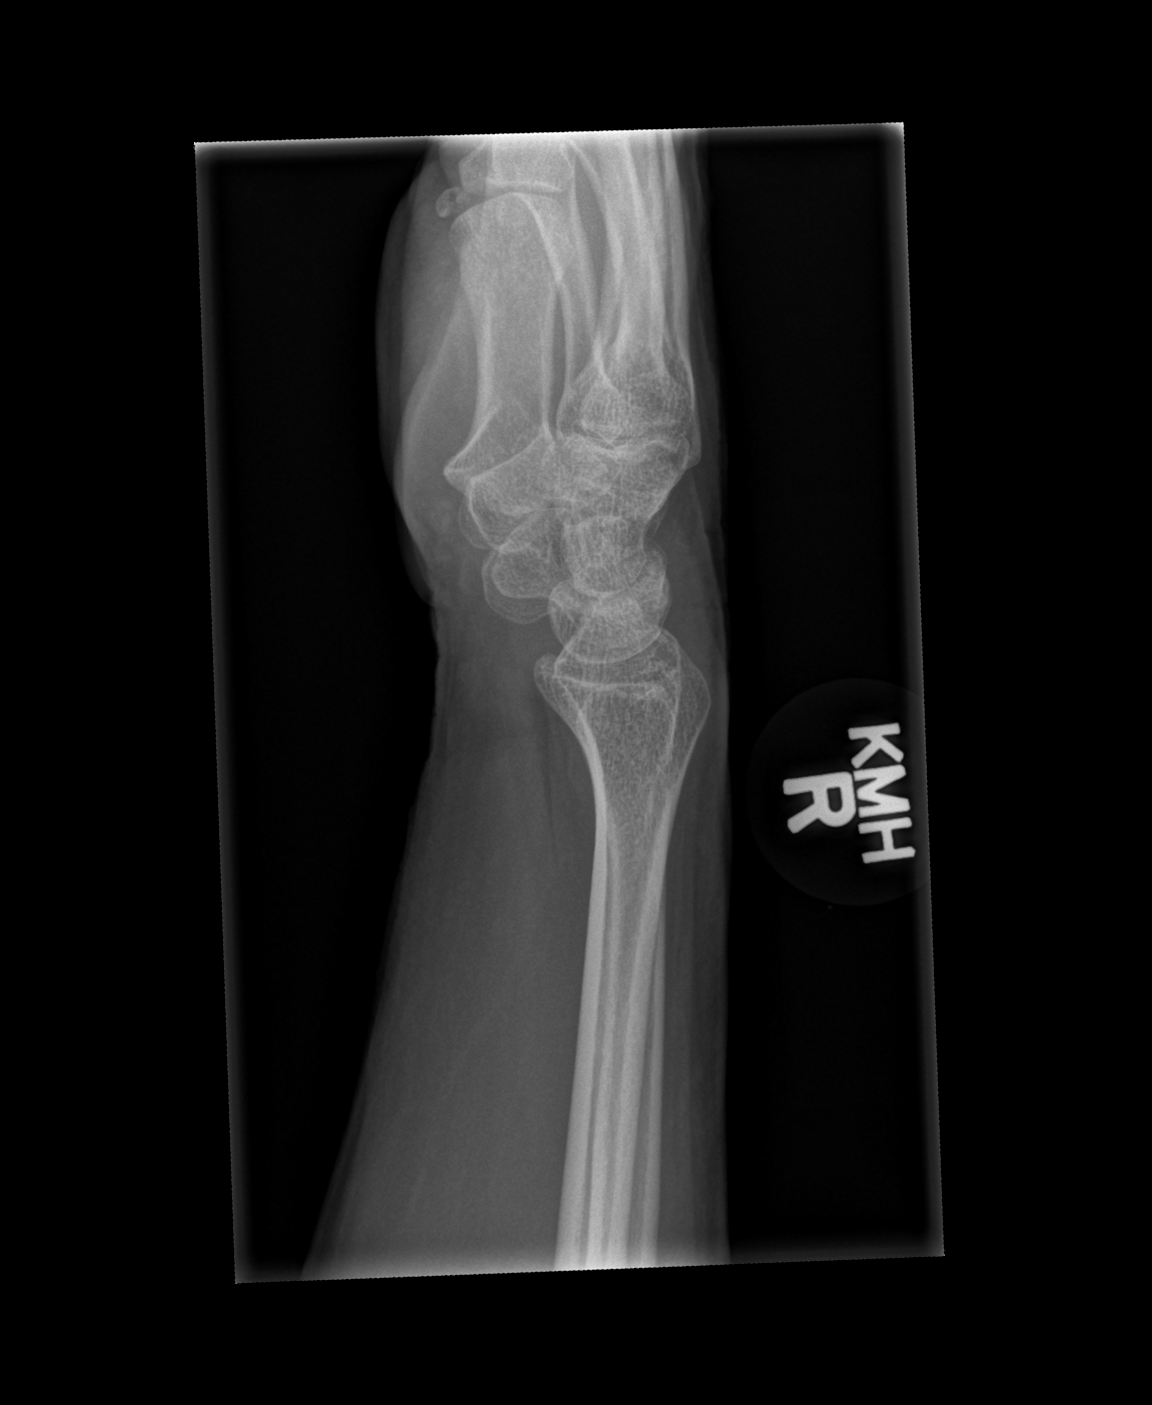

[x wrist navicular view right]
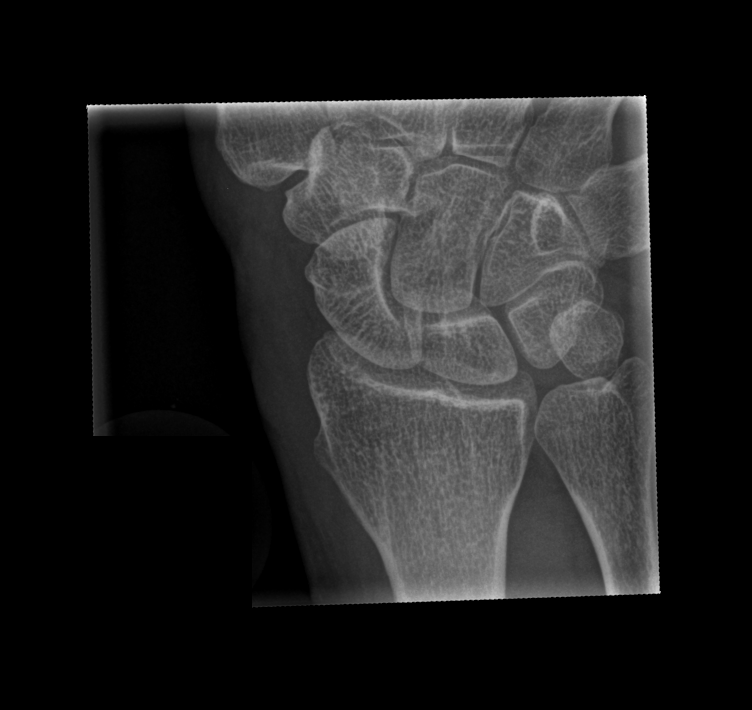

[4 of 4 positions shown; findings below may reference images not displayed]

FINDINGS: Negative for fracture or dislocation. Normal alignment of the right
wrist. No significant soft tissue swelling. Tiny loose body or small
ossification just distal to the ulna.
IMPRESSION: No acute abnormality.

## 2015-05-26 MED ORDER — PREDNISONE 20 MG PO TABS
40.0000 mg | ORAL_TABLET | Freq: Once | ORAL | Status: DC
  Filled 2015-05-26: qty 2

## 2015-05-26 MED ORDER — PREDNISONE 20 MG PO TABS: ORAL_TABLET | ORAL | Status: DC

## 2015-05-26 NOTE — ED Notes (Signed)
Pt complaining of right wrist pain x 2 months. Did nothing to injure it. Tried using a brace with no relief. No obvious injury, redness, swelling. Pain getting in the way of activities of daily living.

## 2015-05-26 NOTE — ED Provider Notes (Addendum)
CSN: [id number]     Arrival date & time 05/26/15  2113 History  By signing my name below, I, Danielle Serrano, attest that this documentation has been prepared under the direction and in the presence of Garald Balding, NP. Electronically Signed: Helane Serrano, ED Scribe. 05/26/2015. 10:00 PM.    Chief Complaint  Patient presents with  . Wrist Pain   The history is provided by the patient. No language interpreter was used.   HPI Comments: Danielle Serrano is a 65 y.o. female smoker at 0.5 ppd with a PMHx of LGSIL and osteopenia who presents to the Emergency Department complaining of constant, worsening right wrist pain shooting up along her arm onset 2 months ago. She states she has been wearing a brace 24/7 until 2 days ago without relief. 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 She does not have a PCP. Pt denies any previous trauma or injury to the area. She also denies any swelling or redness to the area. Pt is allergic to penicillins.   Past Medical History  Diagnosis Date  . LGSIL (low grade squamous intraepithelial dysplasia) 2012    +ECC +HRHPV  . Osteopenia 07/2010    t score - 2.2, FRAX 8.8%/1.3%  . Depression   . Reflux   . SVD (spontaneous vaginal delivery)     x 3   Past Surgical History  Procedure Laterality Date  . Tubal ligation    . Hysteroscopy  89, 2001    w/D&C for benign polyp  . Cervical biopsy  w/ loop electrode excision  07/2010    LGSIL positive ectocervical margin negative endocervical margin  . Wisdom tooth extraction    . Colonoscopy    . Total abdominal hysterectomy w/ bilateral salpingoophorectomy  02/2012    persistent low-grade cervical dysplasia   Family History  Problem Relation Age of Onset  . Colon cancer Father   . Dementia Mother    Social History  Substance Use Topics  . Smoking status: Current Every Day Smoker -- 0.50 packs/day for 20 years    Types: Cigarettes  . Smokeless tobacco: Never Used     Comment: pt has smoked  for 20 years, she quit smoking for a 2 year period and recently started back in the last few months.  . Alcohol Use: Yes     Comment: occassional   OB History    Gravida Para Term Preterm AB TAB SAB Ectopic Multiple Living   3 3        3      Review of Systems  Musculoskeletal: Positive for arthralgias. Negative for joint swelling.  Skin: Negative for color change and wound.    Allergies  Penicillins  Home Medications   Prior to Admission medications   Medication Sig Start Date End Date Taking? Authorizing Provider  ibuprofen (ADVIL,MOTRIN) 200 MG tablet Take 400 mg by mouth every 6 (six) hours as needed for headache, mild pain or moderate pain.   Yes Historical Provider, MD  omeprazole (PRILOSEC) 20 MG capsule Take 20 mg by mouth daily as needed (heartburn).    Yes Historical Provider, MD  predniSONE (DELTASONE) 20 MG tablet 3 Tabs PO Days 1-3, then 2 tabs PO Days 4-6, then 1 tab PO Day 7-9, then Half Tab PO Day 10-12 05/26/15   Junius Creamer, NP   BP 109/71 mmHg  Pulse 74  Temp(Src) 98.4 F (36.9 C) (Oral)  Resp 18  SpO2 100% Physical Exam  Constitutional: She is  oriented to person, place, and time. She appears well-developed and well-nourished.  HENT:  Head: Normocephalic.  Eyes: Pupils are equal, round, and reactive to light.  Cardiovascular: Normal rate and regular rhythm.   Pulmonary/Chest: Effort normal and breath sounds normal.  Abdominal: Soft.  Musculoskeletal: She exhibits tenderness. She exhibits no edema.       Arms: Neurological: She is alert and oriented to person, place, and time.  Skin: Skin is warm. No erythema.  Nursing note and vitals reviewed.   ED Course  Procedures  DIAGNOSTIC STUDIES: Oxygen Saturation is 100% on RA, normal by my interpretation.    COORDINATION OF CARE: 9:51 PM - Discussed probable tendonitis. Discussed plans to wait on diagnostic imaging. Pt advised of plan for treatment and pt agrees.  Labs Review Labs Reviewed - No data to  display  Imaging Review No results found. I have personally reviewed and evaluated these images and lab results as part of my medical decision-making.   EKG Interpretation None      MDM   Final diagnoses:  Tendonitis of wrist, right   I personally performed the services described in this documentation, which was scribed in my presence. The recorded information has been reviewed and is accurate.  Junius Creamer, NP 05/26/15 [place], MD 05/27/15 [place], NP 06/18/15 [place], MD 06/18/15 [phone]

## 2015-05-26 NOTE — Discharge Instructions (Signed)
Wear the brace at all times except while bathing been given a prescription for prednisone.  This is a very potent anti-inflammatory take this on a regular basis as prescribed.  Please do not take any additional anti-inflammatory such as Advil, Naprosyn or ibuprofen.  You've also been given a referral to hand surgeon if after taking the course of steroids and wearing the brace.  The pain is not any better

## 2015-05-26 NOTE — ED Notes (Signed)
Patient was alert, oriented and stable upon discharge. RN went over AVS and patient had no further questions.  

## 2015-05-26 NOTE — ED Notes (Signed)
Patient transported to X-ray 

## 2018-03-22 DIAGNOSIS — R69 Illness, unspecified: Secondary | ICD-10-CM | POA: Diagnosis not present

## 2018-03-27 ENCOUNTER — Other Ambulatory Visit: Payer: Self-pay | Admitting: Gastroenterology

## 2018-03-27 DIAGNOSIS — R1030 Lower abdominal pain, unspecified: Secondary | ICD-10-CM

## 2018-03-27 DIAGNOSIS — R1031 Right lower quadrant pain: Secondary | ICD-10-CM | POA: Diagnosis not present

## 2018-03-27 DIAGNOSIS — Z86010 Personal history of colonic polyps: Secondary | ICD-10-CM | POA: Diagnosis not present

## 2018-03-27 DIAGNOSIS — R1032 Left lower quadrant pain: Secondary | ICD-10-CM | POA: Diagnosis not present

## 2018-03-27 DIAGNOSIS — Z800 Family history of malignant neoplasm of digestive organs: Secondary | ICD-10-CM | POA: Diagnosis not present

## 2018-03-28 DIAGNOSIS — L821 Other seborrheic keratosis: Secondary | ICD-10-CM | POA: Diagnosis not present

## 2018-03-28 DIAGNOSIS — C44529 Squamous cell carcinoma of skin of other part of trunk: Secondary | ICD-10-CM | POA: Diagnosis not present

## 2018-03-28 DIAGNOSIS — D485 Neoplasm of uncertain behavior of skin: Secondary | ICD-10-CM | POA: Diagnosis not present

## 2018-03-28 DIAGNOSIS — L570 Actinic keratosis: Secondary | ICD-10-CM | POA: Diagnosis not present

## 2018-03-28 DIAGNOSIS — L578 Other skin changes due to chronic exposure to nonionizing radiation: Secondary | ICD-10-CM | POA: Diagnosis not present

## 2018-04-04 DIAGNOSIS — M8580 Other specified disorders of bone density and structure, unspecified site: Secondary | ICD-10-CM | POA: Diagnosis not present

## 2018-04-04 DIAGNOSIS — Z0000 Encounter for general adult medical examination without abnormal findings: Secondary | ICD-10-CM | POA: Diagnosis not present

## 2018-04-04 DIAGNOSIS — E782 Mixed hyperlipidemia: Secondary | ICD-10-CM | POA: Diagnosis not present

## 2018-04-04 DIAGNOSIS — R69 Illness, unspecified: Secondary | ICD-10-CM | POA: Diagnosis not present

## 2018-04-04 DIAGNOSIS — K219 Gastro-esophageal reflux disease without esophagitis: Secondary | ICD-10-CM | POA: Diagnosis not present

## 2018-04-04 DIAGNOSIS — Z1159 Encounter for screening for other viral diseases: Secondary | ICD-10-CM | POA: Diagnosis not present

## 2018-04-04 DIAGNOSIS — M8588 Other specified disorders of bone density and structure, other site: Secondary | ICD-10-CM | POA: Diagnosis not present

## 2018-04-05 ENCOUNTER — Ambulatory Visit
Admission: RE | Admit: 2018-04-05 | Discharge: 2018-04-05 | Disposition: A | Discharge status: Home / Self Care | Payer: Medicare HMO | Source: Ambulatory Visit | Attending: Gastroenterology | Admitting: Gastroenterology

## 2018-04-05 DIAGNOSIS — R1030 Lower abdominal pain, unspecified: Secondary | ICD-10-CM | POA: Diagnosis not present

## 2018-04-05 MED ORDER — IOPAMIDOL (ISOVUE-300) INJECTION 61%
100.0000 mL | Freq: Once | INTRAVENOUS | Status: AC | PRN
  Administered 2018-04-05: 100 mL via INTRAVENOUS

## 2018-04-09 DIAGNOSIS — Z800 Family history of malignant neoplasm of digestive organs: Secondary | ICD-10-CM | POA: Diagnosis not present

## 2018-04-09 DIAGNOSIS — K640 First degree hemorrhoids: Secondary | ICD-10-CM | POA: Diagnosis not present

## 2018-04-09 DIAGNOSIS — D126 Benign neoplasm of colon, unspecified: Secondary | ICD-10-CM | POA: Diagnosis not present

## 2018-04-09 DIAGNOSIS — Z86010 Personal history of colonic polyps: Secondary | ICD-10-CM | POA: Diagnosis not present

## 2018-04-11 DIAGNOSIS — C44519 Basal cell carcinoma of skin of other part of trunk: Secondary | ICD-10-CM | POA: Diagnosis not present

## 2018-04-12 DIAGNOSIS — D126 Benign neoplasm of colon, unspecified: Secondary | ICD-10-CM | POA: Diagnosis not present

## 2018-05-02 DIAGNOSIS — Z1231 Encounter for screening mammogram for malignant neoplasm of breast: Secondary | ICD-10-CM | POA: Diagnosis not present

## 2018-05-02 DIAGNOSIS — Z803 Family history of malignant neoplasm of breast: Secondary | ICD-10-CM | POA: Diagnosis not present

## 2018-05-02 DIAGNOSIS — M8589 Other specified disorders of bone density and structure, multiple sites: Secondary | ICD-10-CM | POA: Diagnosis not present

## 2018-08-01 DIAGNOSIS — Z86010 Personal history of colonic polyps: Secondary | ICD-10-CM | POA: Diagnosis not present

## 2018-08-01 DIAGNOSIS — R1084 Generalized abdominal pain: Secondary | ICD-10-CM | POA: Diagnosis not present

## 2018-12-24 ENCOUNTER — Emergency Department (HOSPITAL_COMMUNITY)
Admission: EM | Admit: 2018-12-24 | Discharge: 2018-12-24 | Discharge status: Against Medical Advice | Payer: Medicare HMO

## 2018-12-24 ENCOUNTER — Other Ambulatory Visit: Payer: Self-pay

## 2018-12-25 DIAGNOSIS — K439 Ventral hernia without obstruction or gangrene: Secondary | ICD-10-CM | POA: Diagnosis not present

## 2019-01-06 DIAGNOSIS — Z01812 Encounter for preprocedural laboratory examination: Secondary | ICD-10-CM | POA: Diagnosis not present

## 2019-01-06 DIAGNOSIS — Z1159 Encounter for screening for other viral diseases: Secondary | ICD-10-CM | POA: Diagnosis not present

## 2019-01-06 DIAGNOSIS — K439 Ventral hernia without obstruction or gangrene: Secondary | ICD-10-CM | POA: Diagnosis not present

## 2019-01-06 DIAGNOSIS — Z20828 Contact with and (suspected) exposure to other viral communicable diseases: Secondary | ICD-10-CM | POA: Diagnosis not present

## 2019-01-09 DIAGNOSIS — Z01812 Encounter for preprocedural laboratory examination: Secondary | ICD-10-CM | POA: Diagnosis not present

## 2019-01-09 DIAGNOSIS — K439 Ventral hernia without obstruction or gangrene: Secondary | ICD-10-CM | POA: Diagnosis not present

## 2019-01-09 DIAGNOSIS — Z01810 Encounter for preprocedural cardiovascular examination: Secondary | ICD-10-CM | POA: Diagnosis not present

## 2019-01-11 DIAGNOSIS — R9431 Abnormal electrocardiogram [ECG] [EKG]: Secondary | ICD-10-CM | POA: Diagnosis not present

## 2019-01-13 DIAGNOSIS — K439 Ventral hernia without obstruction or gangrene: Secondary | ICD-10-CM | POA: Diagnosis not present

## 2019-01-13 DIAGNOSIS — K660 Peritoneal adhesions (postprocedural) (postinfection): Secondary | ICD-10-CM | POA: Diagnosis not present

## 2019-02-13 DIAGNOSIS — E782 Mixed hyperlipidemia: Secondary | ICD-10-CM | POA: Diagnosis not present

## 2019-02-26 DIAGNOSIS — R69 Illness, unspecified: Secondary | ICD-10-CM | POA: Diagnosis not present

## 2019-03-05 HISTORY — PX: HERNIA REPAIR: SHX51

## 2019-05-01 DIAGNOSIS — E782 Mixed hyperlipidemia: Secondary | ICD-10-CM | POA: Diagnosis not present

## 2019-05-06 DIAGNOSIS — Z7189 Other specified counseling: Secondary | ICD-10-CM | POA: Diagnosis not present

## 2019-05-06 DIAGNOSIS — Z0000 Encounter for general adult medical examination without abnormal findings: Secondary | ICD-10-CM | POA: Diagnosis not present

## 2019-05-06 DIAGNOSIS — K219 Gastro-esophageal reflux disease without esophagitis: Secondary | ICD-10-CM | POA: Diagnosis not present

## 2019-05-06 DIAGNOSIS — H9319 Tinnitus, unspecified ear: Secondary | ICD-10-CM | POA: Diagnosis not present

## 2019-05-06 DIAGNOSIS — R69 Illness, unspecified: Secondary | ICD-10-CM | POA: Diagnosis not present

## 2019-05-06 DIAGNOSIS — M8580 Other specified disorders of bone density and structure, unspecified site: Secondary | ICD-10-CM | POA: Diagnosis not present

## 2019-05-06 DIAGNOSIS — E782 Mixed hyperlipidemia: Secondary | ICD-10-CM | POA: Diagnosis not present

## 2019-05-13 DIAGNOSIS — Z803 Family history of malignant neoplasm of breast: Secondary | ICD-10-CM | POA: Diagnosis not present

## 2019-05-13 DIAGNOSIS — Z1231 Encounter for screening mammogram for malignant neoplasm of breast: Secondary | ICD-10-CM | POA: Diagnosis not present

## 2020-04-05 ENCOUNTER — Other Ambulatory Visit: Payer: Self-pay | Admitting: Family Medicine

## 2020-04-05 ENCOUNTER — Ambulatory Visit
Admission: RE | Admit: 2020-04-05 | Discharge: 2020-04-05 | Disposition: A | Discharge status: Home / Self Care | Payer: Medicare HMO | Source: Ambulatory Visit | Attending: Family Medicine | Admitting: Family Medicine

## 2020-04-05 DIAGNOSIS — R002 Palpitations: Secondary | ICD-10-CM

## 2020-04-09 ENCOUNTER — Other Ambulatory Visit: Payer: Self-pay

## 2020-04-09 ENCOUNTER — Ambulatory Visit: Payer: Medicare HMO | Admitting: Cardiology

## 2020-04-09 ENCOUNTER — Telehealth: Payer: Self-pay | Admitting: Radiology

## 2020-04-09 ENCOUNTER — Encounter: Payer: Self-pay | Admitting: Cardiology

## 2020-04-09 VITALS — BP 110/70 | HR 79 | Ht 62.0 in | Wt 144.0 lb

## 2020-04-09 DIAGNOSIS — R002 Palpitations: Secondary | ICD-10-CM | POA: Diagnosis not present

## 2020-04-09 DIAGNOSIS — R55 Syncope and collapse: Secondary | ICD-10-CM

## 2020-04-09 NOTE — Progress Notes (Signed)
Cardiology Office Note:    Date:  04/09/2020   ID:  Danielle Serrano, DOB [birth year], MRN [id number]  PCP:  Mayra Neer, MD  [facility] Cardiologist:  No primary care provider on file.  [place] [facility] Electrophysiologist:  None   Referring MD: Mayra Neer, MD    History of Present Illness:    Danielle Serrano is a 69 y.o. female here for the evaluation of palpitations with presyncope at the request of Dr. Mayra Neer.  Feels like hesitation then like a near fainting feeling. Like it slows down and and stops, Still have flutters at times.   No chest pain No syncope, but near syncope  Family history --MGM - died 35 --Mother has pacer at 68 --Brother has AFIB --Daughter has WPW. Had ablation  Tobacco use - sometimes would quit. Off and on.   No ETOH.   No DM, HTN, no Cancer, no bleeding. No HA.    Past Medical History:  Diagnosis Date  . Depression   . LGSIL (low grade squamous intraepithelial dysplasia) 2012   +ECC +HRHPV  . Osteopenia 07/2010   t score - 2.2, FRAX 8.8%/1.3%  . Reflux   . SVD (spontaneous vaginal delivery)    x 3    Past Surgical History:  Procedure Laterality Date  . CERVICAL BIOPSY  W/ LOOP ELECTRODE EXCISION  07/2010   LGSIL positive ectocervical margin negative endocervical margin  . COLONOSCOPY    . HYSTEROSCOPY  92, 2001   w/D&C for benign polyp  . TOTAL ABDOMINAL HYSTERECTOMY W/ BILATERAL SALPINGOOPHORECTOMY  02/2012   persistent low-grade cervical dysplasia  . TUBAL LIGATION    . WISDOM TOOTH EXTRACTION      Current Medications: Current Meds  Medication Sig  . ibuprofen (ADVIL,MOTRIN) 200 MG tablet Take 400 mg by mouth every 6 (six) hours as needed for headache, mild pain or moderate pain.     Allergies:   Penicillins   Social History   Socioeconomic History  . Marital status: Legally Separated    Spouse name: Not on file  . Number of children: Not on file  . Years of education: Not on file  . Highest  education level: Not on file  Occupational History  . Not on file  Tobacco Use  . Smoking status: Current Every Day Smoker    Packs/day: 0.50    Years: 20.00    Pack years: 10.00    Types: Cigarettes  . Smokeless tobacco: Never Used  . Tobacco comment: pt has smoked for 20 years, she quit smoking for a 2 year period and recently started back in the last few months.  Substance and Sexual Activity  . Alcohol use: Yes    Comment: occassional  . Drug use: No  . Sexual activity: Yes    Birth control/protection: Post-menopausal, Surgical  Other Topics Concern  . Not on file  Social History Narrative  . Not on file   Social Determinants of Health   Financial Resource Strain:   . Difficulty of Paying Living Expenses: Not on file  Food Insecurity:   . Worried About Charity fundraiser in the Last Year: Not on file  . Ran Out of Food in the Last Year: Not on file  Transportation Needs:   . Lack of Transportation (Medical): Not on file  . Lack of Transportation (Non-Medical): Not on file  Physical Activity:   . Days of Exercise per Week: Not on file  . Minutes of Exercise per Session: Not  on file  Stress:   . Feeling of Stress : Not on file  Social Connections:   . Frequency of Communication with Friends and Family: Not on file  . Frequency of Social Gatherings with Friends and Family: Not on file  . Attends Religious Services: Not on file  . Active Member of Clubs or Organizations: Not on file  . Attends Archivist Meetings: Not on file  . Marital Status: Not on file     Family History: The patient's family history includes Colon cancer in her father; Dementia in her mother.  ROS:  Please see the history of present illness.   Denies any bleeding.  Has occasional joint pain, no rashes, no fevers no chills no headaches no chest pain all other systems reviewed and are negative.  EKGs/Labs/Other Studies Reviewed:    The following studies were reviewed today: CT scan  of abdomen pelvis 04/05/2018 personally reviewed and interpreted demonstrates aortic atherosclerosis.  04/05/2020 chest x-ray personally reviewed and interpreted demonstrates normal heart size normal lungs.  EKG:  EKG is  ordered today.  The ekg ordered today demonstrates ectopic atrial rhythm 79 bpm with nonspecific T wave changes.  PR interval 164 QRS duration 68 QTC 410  Recent Labs: No results found for requested labs within last 8760 hours.  Recent Lipid Panel No results found for: CHOL, TRIG, HDL, CHOLHDL, VLDL, LDLCALC, LDLDIRECT   Physical Exam:    VS:  BP 110/70   Pulse 79   Ht $R'5\' 2"'hn$  (1.575 m)   Wt 144 lb (65.3 kg)   SpO2 94%   BMI 26.34 kg/m     Wt Readings from Last 3 Encounters:  04/09/20 144 lb (65.3 kg)  02/26/12 138 lb (62.6 kg)  02/19/12 138 lb (62.6 kg)     GEN:  Well nourished, well developed in no acute distress HEENT: Normal NECK: No JVD; No carotid bruits LYMPHATICS: No lymphadenopathy CARDIAC: RRR, no murmurs, rubs, gallops RESPIRATORY:  Clear to auscultation without rales, wheezing or rhonchi  ABDOMEN: Soft, non-tender, non-distended MUSCULOSKELETAL:  No edema; No deformity  SKIN: Warm and dry NEUROLOGIC:  Alert and oriented x 3 PSYCHIATRIC:  Normal affect   ASSESSMENT:    1. Palpitations   2. Near syncope    PLAN:    In order of problems listed above:  Palpitations terminating with what feels like near syncope -Differential includes short span of atrial fibrillation with postconversion pause or perhaps PVCs or PACs with long compensatory pause.  Even tachycardia arrhythmias might be the answer. -I will go ahead and check an active ZIO monitor. -I will also check an echocardiogram to ensure proper structure and function of her heart.  Aortic atherosclerosis -Personally reviewed her CT scan from 2019 which demonstrated aortic atherosclerosis.  We discussed and showed her the images.  Would consider 81 mg of aspirin as well as Crestor for  plaque stabilization.  She would like to think about this.  Tobacco use -Encouraged tobacco cessation at length.   Medication Adjustments/Labs and Tests Ordered: Current medicines are reviewed at length with the patient today.  Concerns regarding medicines are outlined above.  Orders Placed This Encounter  Procedures  . LONG TERM MONITOR-LIVE TELEMETRY (3-14 DAYS)  . EKG 12-Lead   No orders of the defined types were placed in this encounter.   Patient Instructions  Medication Instructions:  The current medical regimen is effective;  continue present plan and medications.  *If you need a refill on your cardiac medications before  your next appointment, please call your pharmacy*  Testing/Procedures: ZIO AT Long term monitor-Live Telemetry  Your physician has requested you wear a ZIO patch monitor for 14 days. This is a single patch monitor. Ir[employer] supplies one patch monitor per enrollment. Additional stickers are not available.  Please do not apply patch if you will be having a Nuclear Stress Test, Echocardiogram, Cardiac CT, MRI, or Chest Xray during the time frame you would be wearing the monitor. The patch cannot be worn during these tests. You cannot remove and re-apply the ZIO AT patch monitor.   Your ZIO patch monitor will be sent Fed Ex from [employer] directly to your home address. The monitor may also be mailed to a PO BOX if home delivery is not available. It may take 3-5 days to receive your monitor after you have been enrolled.  Once you have received you monitor, please review enclosed instructions. Your monitor has already been registered assigning a specific monitor serial # to you.   Applying the monitor  Shave hair from upper left chest.  Hold abrader disc by orange tab. Rub abrader in 40 strokes over left upper chest as indicated in your monitor instructions.  Clean area with 4 enclosed alcohol pads. Use all pads to ensure the area is cleaned thoroughly.  Let dry.  Apply patch as indicated in monitor instructions. Patch will be placed under collarbone on left side of chest with arrow pointing upward.  Rub patch adhesive wings for 2 minutes. Remove the white label marked "1". Remove the white label marked "2". Rub patch adhesive wings for 2 additional minutes.  While looking in a mirror, press and release button in center of patch. A small green light will flash 3-4 times. This will be your only indicator the monitor has been turned on.  Do not shower for the first 24 hours. You may shower after the first 24 hours.  Press the button if you feel a symptom. You will hear a small click. Record Date, Time and Symptom in the Patient Log.   Starting the Gateway  In your kit there is a Hydrographic surveyor box the size of a cellphone. This is Airline pilot. It transmits all your recorded data to [facility]. This box must stay within 10 feet of you at all times. Open the box and push the * button. There will be a light that blinks orange and then green a few times. When the light stops blinking, the Gateway is connected to the ZIO patch.  Call [employer] at [phone] to confirm your monitor is transmitting.   Returning your monitor  Remove your patch and place it inside the [place]. In the lower half of the Gateway there is a white bag with prepaid postage on it. Place Gateway in bag and seal. Mail package back to [place] as soon as possible. Your physician should have your final report approximately 7 days after you have mailed back your monitor.   Call [place] at [phone] if you have questions regarding your ZIO AT patch monitor. Call them immediately if you see an orange light blinking on your monitor.  If your monitor falls off in less than 4 days contact our Monitor department at [phone]. If your monitor becomes loose or falls off after 4 days call [employer] at [phone] for suggestions on securing your monitor.     Follow-Up: At [facility], you and your health needs are our priority.  As part of our continuing mission to  provide you with exceptional heart care, we have created designated Provider Care Teams.  These Care Teams include your primary Cardiologist (physician) and Advanced Practice Providers (APPs -  Physician Assistants and Nurse Practitioners) who all work together to provide you with the care you need, when you need it.  We recommend signing up for the patient portal called "MyChart".  Sign up information is provided on this After Visit Summary.  MyChart is used to connect with patients for Virtual Visits (Telemedicine).  Patients are able to view lab/test results, encounter notes, upcoming appointments, etc.  Non-urgent messages can be sent to your provider as well.   To learn more about what you can do with MyChart, go to [email or web address].    Your next appointment:   4-6 week(s)  The format for your next appointment:   In Person  Provider:   Candee Furbish, MD   Thank you for choosing [facility]!!         Signed, Candee Furbish, MD  04/09/2020 5:12 PM    [place]

## 2020-04-09 NOTE — Patient Instructions (Signed)
Medication Instructions:  The current medical regimen is effective;  continue present plan and medications.  *If you need a refill on your cardiac medications before your next appointment, please call your pharmacy*  Testing/Procedures: ZIO AT Long term monitor-Live Telemetry  Your physician has requested you wear a ZIO patch monitor for 14 days. This is a single patch monitor. Irhythm supplies one patch monitor per enrollment. Additional stickers are not available.  Please do not apply patch if you will be having a Nuclear Stress Test, Echocardiogram, Cardiac CT, MRI, or Chest Xray during the time frame you would be wearing the monitor. The patch cannot be worn during these tests. You cannot remove and re-apply the ZIO AT patch monitor.   Your ZIO patch monitor will be sent Fed Ex from [employer] directly to your home address. The monitor may also be mailed to a PO BOX if home delivery is not available. It may take 3-5 days to receive your monitor after you have been enrolled.  Once you have received you monitor, please review enclosed instructions. Your monitor has already been registered assigning a specific monitor serial # to you.   Applying the monitor  Shave hair from upper left chest.  Hold abrader disc by orange tab. Rub abrader in 40 strokes over left upper chest as indicated in your monitor instructions.  Clean area with 4 enclosed alcohol pads. Use all pads to ensure the area is cleaned thoroughly. Let dry.  Apply patch as indicated in monitor instructions. Patch will be placed under collarbone on left side of chest with arrow pointing upward.  Rub patch adhesive wings for 2 minutes. Remove the white label marked "1". Remove the white label marked "2". Rub patch adhesive wings for 2 additional minutes.  While looking in a mirror, press and release button in center of patch. A small green light will flash 3-4 times. This will be your only indicator the monitor has been turned  on.  Do not shower for the first 24 hours. You may shower after the first 24 hours.  Press the button if you feel a symptom. You will hear a small click. Record Date, Time and Symptom in the Patient Log.   Starting the Gateway  In your kit there is a Hydrographic surveyor box the size of a cellphone. This is Airline pilot. It transmits all your recorded data to [facility]. This box must stay within 10 feet of you at all times. Open the box and push the * button. There will be a light that blinks orange and then green a few times. When the light stops blinking, the Gateway is connected to the ZIO patch.  Call [employer] at [phone] to confirm your monitor is transmitting.   Returning your monitor  Remove your patch and place it inside the [place]. In the lower half of the Gateway there is a white bag with prepaid postage on it. Place Gateway in bag and seal. Mail package back to [place] as soon as possible. Your physician should have your final report approximately 7 days after you have mailed back your monitor.   Call [place] at [phone] if you have questions regarding your ZIO AT patch monitor. Call them immediately if you see an orange light blinking on your monitor.  If your monitor falls off in less than 4 days contact our Monitor department at [phone]. If your monitor becomes loose or falls off after 4 days call [employer] at [phone] for suggestions on securing  your monitor.    Follow-Up: At [facility], you and your health needs are our priority.  As part of our continuing mission to provide you with exceptional heart care, we have created designated Provider Care Teams.  These Care Teams include your primary Cardiologist (physician) and Advanced Practice Providers (APPs -  Physician Assistants and Nurse Practitioners) who all work together to provide you with the care you need, when you need it.  We recommend signing up for the patient portal called  "MyChart".  Sign up information is provided on this After Visit Summary.  MyChart is used to connect with patients for Virtual Visits (Telemedicine).  Patients are able to view lab/test results, encounter notes, upcoming appointments, etc.  Non-urgent messages can be sent to your provider as well.   To learn more about what you can do with MyChart, go to [email or web address].    Your next appointment:   4-6 week(s)  The format for your next appointment:   In Person  Provider:   Candee Furbish, MD   Thank you for choosing [facility]!!

## 2020-04-09 NOTE — Telephone Encounter (Signed)
Enrolled patient for a 14 day Zio AT monitor to be mailed to patients home.  

## 2020-04-15 ENCOUNTER — Other Ambulatory Visit (INDEPENDENT_AMBULATORY_CARE_PROVIDER_SITE_OTHER): Payer: Medicare HMO

## 2020-04-15 DIAGNOSIS — R002 Palpitations: Secondary | ICD-10-CM

## 2020-04-15 DIAGNOSIS — R55 Syncope and collapse: Secondary | ICD-10-CM

## 2020-05-27 ENCOUNTER — Encounter: Payer: Self-pay | Admitting: Cardiology

## 2020-05-27 ENCOUNTER — Other Ambulatory Visit: Payer: Self-pay

## 2020-05-27 ENCOUNTER — Ambulatory Visit: Payer: Medicare HMO | Admitting: Cardiology

## 2020-05-27 VITALS — BP 116/74 | HR 63 | Ht 63.5 in | Wt 143.4 lb

## 2020-05-27 DIAGNOSIS — R002 Palpitations: Secondary | ICD-10-CM | POA: Diagnosis not present

## 2020-05-27 DIAGNOSIS — I700 Atherosclerosis of aorta: Secondary | ICD-10-CM

## 2020-05-27 DIAGNOSIS — Z79899 Other long term (current) drug therapy: Secondary | ICD-10-CM | POA: Diagnosis not present

## 2020-05-27 DIAGNOSIS — E782 Mixed hyperlipidemia: Secondary | ICD-10-CM | POA: Diagnosis not present

## 2020-05-27 MED ORDER — ROSUVASTATIN CALCIUM 10 MG PO TABS: 10.0000 mg | ORAL_TABLET | Freq: Every day | ORAL | 3 refills | Status: DC

## 2020-05-27 MED ORDER — ASPIRIN EC 81 MG PO TBEC: 81.0000 mg | DELAYED_RELEASE_TABLET | Freq: Every day | ORAL | 3 refills | Status: AC

## 2020-05-27 MED ORDER — METOPROLOL SUCCINATE ER 25 MG PO TB24: 25.0000 mg | ORAL_TABLET | Freq: Every day | ORAL | 3 refills | Status: DC

## 2020-05-27 NOTE — Progress Notes (Signed)
Cardiology Office Note:    Date:  05/27/2020   ID:  NETANYA Serrano, DOB 05/16/1951, MRN [id number]  PCP:  Mayra Neer, MD  [facility] Cardiologist:  No primary care provider on file.  [place] [facility] Electrophysiologist:  None   Referring MD: Mayra Neer, MD    History of Present Illness:    Danielle Serrano is a 70 y.o. female here for follow-up of palpitations, prior  Presyncope.   Family history --MGM - died 57 --Mother has pacer at 47 --Brother has AFIB --Daughter has WPW. Had ablation  Tobacco use - sometimes would quit. Off and on.   No ETOH.   No DM, HTN, no Cancer, no bleeding  Palps are still bothering her.  Desired to try metoprolol.  Past Medical History:  Diagnosis Date  . Depression   . LGSIL (low grade squamous intraepithelial dysplasia) 2012   +ECC +HRHPV  . Osteopenia 07/2010   t score - 2.2, FRAX 8.8%/1.3%  . Reflux   . SVD (spontaneous vaginal delivery)    x 3    Past Surgical History:  Procedure Laterality Date  . CERVICAL BIOPSY  W/ LOOP ELECTRODE EXCISION  07/2010   LGSIL positive ectocervical margin negative endocervical margin  . COLONOSCOPY    . HYSTEROSCOPY  92, 2001   w/D&C for benign polyp  . TOTAL ABDOMINAL HYSTERECTOMY W/ BILATERAL SALPINGOOPHORECTOMY  02/2012   persistent low-grade cervical dysplasia  . TUBAL LIGATION    . WISDOM TOOTH EXTRACTION      Current Medications: Current Meds  Medication Sig  . aspirin EC 81 MG tablet Take 1 tablet (81 mg total) by mouth daily. Swallow whole.  . Cholecalciferol (VITAMIN D3) 25 MCG (1000 UT) CAPS 1 capsule  . metoprolol succinate (TOPROL-XL) 25 MG 24 hr tablet Take 1 tablet (25 mg total) by mouth daily.  . Multiple Vitamins-Minerals (MULTIVITAMIN GUMMIES ADULT) CHEW See admin instructions.  . rosuvastatin (CRESTOR) 10 MG tablet Take 1 tablet (10 mg total) by mouth daily.     Allergies:   Morphine sulfate and Penicillins   Social History   Socioeconomic History   . Marital status: Legally Separated    Spouse name: Not on file  . Number of children: Not on file  . Years of education: Not on file  . Highest education level: Not on file  Occupational History  . Not on file  Tobacco Use  . Smoking status: Current Every Day Smoker    Packs/day: 0.50    Years: 20.00    Pack years: 10.00    Types: Cigarettes  . Smokeless tobacco: Never Used  . Tobacco comment: pt has smoked for 20 years, she quit smoking for a 2 year period and recently started back in the last few months.  Substance and Sexual Activity  . Alcohol use: Yes    Comment: occassional  . Drug use: No  . Sexual activity: Yes    Birth control/protection: Post-menopausal, Surgical  Other Topics Concern  . Not on file  Social History Narrative  . Not on file   Social Determinants of Health   Financial Resource Strain: Not on file  Food Insecurity: Not on file  Transportation Needs: Not on file  Physical Activity: Not on file  Stress: Not on file  Social Connections: Not on file     Family History: The patient's family history includes Colon cancer in her father; Dementia in her mother.  ROS:   Please see the history of present illness.  All other systems reviewed and are negative.  EKGs/Labs/Other Studies Reviewed:    The following studies were reviewed today: CT abdomen 2019-aortic atherosclerosis noted  Chest x-ray 2021 personally reviewed and interpreted shows normal lung and heart size.  Zio patch monitor 05/05/2020: Sinus rhythm with average heart rate of 90 bpm  PVCs and PACs.  Rare episodes of paroxysmal atrial tachycardia, fastest 174 bpm for 14 seconds.  No evidence of atrial fibrillation, no ventricular tachycardia, no pauses  Patient symptoms of fluttering, racing, lightheadedness were associated with sinus rhythm/sinus tachycardia with brief episode of paroxysmal atrial tachycardia  Symptoms of racing or fluttering are likely associated with  brief episodes of PAT or paroxysmal atrial tachycardia. Could consider low-dose beta-blocker or calcium channel blocker for suppression. Otherwise, conservative management, decreasing caffeine, good sleep hygiene, daily exercise is recommended.   Recent Labs: No results found for requested labs within last 8760 hours.  Recent Lipid Panel No results found for: CHOL, TRIG, HDL, CHOLHDL, VLDL, LDLCALC, LDLDIRECT   Risk Assessment/Calculations:       Physical Exam:    VS:  BP 116/74   Pulse 63   Ht 5' 3.5" (1.613 m)   Wt 143 lb 6.4 oz (65 kg)   SpO2 95%   BMI 25.00 kg/m     Wt Readings from Last 3 Encounters:  05/27/20 143 lb 6.4 oz (65 kg)  04/09/20 144 lb (65.3 kg)  02/26/12 138 lb (62.6 kg)     GEN:  Well nourished, well developed in no acute distress HEENT: Normal NECK: No JVD; No carotid bruits LYMPHATICS: No lymphadenopathy CARDIAC: RRR, no murmurs, rubs, gallops RESPIRATORY:  Clear to auscultation without rales, wheezing or rhonchi  ABDOMEN: Soft, non-tender, non-distended MUSCULOSKELETAL:  No edema; No deformity  SKIN: Warm and dry NEUROLOGIC:  Alert and oriented x 3 PSYCHIATRIC:  Normal affect   ASSESSMENT:    1. Palpitations   2. Aortic atherosclerosis ([place])   3. Mixed hyperlipidemia   4. Medication management    PLAN:    In order of problems listed above:  Palpitations, presyncope - ZIO monitor overall was reassuring with no adverse arrhythmias.  Fluttering like sensation could be paroxysmal atrial tachycardia.  Conservative management.  PVCs or PACs noted. -We will go ahead and start metoprolol succinate 25 mg once a day.  Low-dose.  She can always call us if she is still feeling the palpitations and we can increase this dose.  Atherosclerosis of aorta, mixed hyperlipidemia - Noted on prior CT scan.  Previously had discussed the images and showed her the images.  Encouraged aspirin 81 as well as Crestor for plaque stabilization.  -I will start  aspirin 81 - I will start Crestor 10 mg once a day.  Last LDL 168 HDL 46 total cholesterol 245 triglycerides 167.  ALT 16 creatinine 0.7 hemoglobin 13.2 - Check lipid panel and ALT in 3 months.  Hesitant to start medications.  Tobacco use - Continue to encourage tobacco cessation.  69-month follow-up after labs.       Medication Adjustments/Labs and Tests Ordered: Current medicines are reviewed at length with the patient today.  Concerns regarding medicines are outlined above.  Orders Placed This Encounter  Procedures  . ALT  . Lipid panel   Meds ordered this encounter  Medications  . aspirin EC 81 MG tablet    Sig: Take 1 tablet (81 mg total) by mouth daily. Swallow whole.    Dispense:  90 tablet    Refill:  3  .  rosuvastatin (CRESTOR) 10 MG tablet    Sig: Take 1 tablet (10 mg total) by mouth daily.    Dispense:  90 tablet    Refill:  3  . metoprolol succinate (TOPROL-XL) 25 MG 24 hr tablet    Sig: Take 1 tablet (25 mg total) by mouth daily.    Dispense:  90 tablet    Refill:  3    Patient Instructions  Medication Instructions:  Please start Aspirin 81 mg once a day. Start Crestor 10 mg daily and Metoprolol Succinate 25 mg once daily. Continue all other medications as listed.  *If you need a refill on your cardiac medications before your next appointment, please call your pharmacy*  Lab Work: Please have blood work in 3 months (Lipid/ALT)  If you have labs (blood work) drawn today and your tests are completely normal, you will receive your results only by: Marland Kitchen MyChart Message (if you have MyChart) OR . A paper copy in the mail If you have any lab test that is abnormal or we need to change your treatment, we will call you to review the results.  Follow-Up: At [facility], you and your health needs are our priority.  As part of our continuing mission to provide you with exceptional heart care, we have created designated Provider Care Teams.  These Care Teams  include your primary Cardiologist (physician) and Advanced Practice Providers (APPs -  Physician Assistants and Nurse Practitioners) who all work together to provide you with the care you need, when you need it.  We recommend signing up for the patient portal called "MyChart".  Sign up information is provided on this After Visit Summary.  MyChart is used to connect with patients for Virtual Visits (Telemedicine).  Patients are able to view lab/test results, encounter notes, upcoming appointments, etc.  Non-urgent messages can be sent to your provider as well.   To learn more about what you can do with MyChart, go to [email or web address].    Your next appointment:   3 month(s)  The format for your next appointment:   In Person  Provider:   Donato Schultz, MD   Thank you for choosing [facility]!!        Signed, Donato Schultz, MD  05/27/2020 10:29 AM    [facility]

## 2020-05-27 NOTE — Patient Instructions (Signed)
Medication Instructions:  Please start Aspirin 81 mg once a day. Start Crestor 10 mg daily and Metoprolol Succinate 25 mg once daily. Continue all other medications as listed.  *If you need a refill on your cardiac medications before your next appointment, please call your pharmacy*  Lab Work: Please have blood work in 3 months (Lipid/ALT)  If you have labs (blood work) drawn today and your tests are completely normal, you will receive your results only by: Marland Kitchen MyChart Message (if you have MyChart) OR . A paper copy in the mail If you have any lab test that is abnormal or we need to change your treatment, we will call you to review the results.  Follow-Up: At [facility], you and your health needs are our priority.  As part of our continuing mission to provide you with exceptional heart care, we have created designated Provider Care Teams.  These Care Teams include your primary Cardiologist (physician) and Advanced Practice Providers (APPs -  Physician Assistants and Nurse Practitioners) who all work together to provide you with the care you need, when you need it.  We recommend signing up for the patient portal called "MyChart".  Sign up information is provided on this After Visit Summary.  MyChart is used to connect with patients for Virtual Visits (Telemedicine).  Patients are able to view lab/test results, encounter notes, upcoming appointments, etc.  Non-urgent messages can be sent to your provider as well.   To learn more about what you can do with MyChart, go to [email or web address].    Your next appointment:   3 month(s)  The format for your next appointment:   In Person  Provider:   Donato Schultz, MD   Thank you for choosing [facility]!!

## 2020-06-21 DIAGNOSIS — F411 Generalized anxiety disorder: Secondary | ICD-10-CM | POA: Diagnosis not present

## 2020-06-21 DIAGNOSIS — R69 Illness, unspecified: Secondary | ICD-10-CM | POA: Diagnosis not present

## 2020-06-21 DIAGNOSIS — H9319 Tinnitus, unspecified ear: Secondary | ICD-10-CM | POA: Diagnosis not present

## 2020-06-21 DIAGNOSIS — K219 Gastro-esophageal reflux disease without esophagitis: Secondary | ICD-10-CM | POA: Diagnosis not present

## 2020-06-21 DIAGNOSIS — M8580 Other specified disorders of bone density and structure, unspecified site: Secondary | ICD-10-CM | POA: Diagnosis not present

## 2020-06-21 DIAGNOSIS — F324 Major depressive disorder, single episode, in partial remission: Secondary | ICD-10-CM | POA: Diagnosis not present

## 2020-06-21 DIAGNOSIS — R002 Palpitations: Secondary | ICD-10-CM | POA: Diagnosis not present

## 2020-06-21 DIAGNOSIS — Z0000 Encounter for general adult medical examination without abnormal findings: Secondary | ICD-10-CM | POA: Diagnosis not present

## 2020-06-21 DIAGNOSIS — E782 Mixed hyperlipidemia: Secondary | ICD-10-CM | POA: Diagnosis not present

## 2020-06-21 DIAGNOSIS — I700 Atherosclerosis of aorta: Secondary | ICD-10-CM | POA: Diagnosis not present

## 2020-07-02 DIAGNOSIS — Z1231 Encounter for screening mammogram for malignant neoplasm of breast: Secondary | ICD-10-CM | POA: Diagnosis not present

## 2020-07-02 DIAGNOSIS — M8589 Other specified disorders of bone density and structure, multiple sites: Secondary | ICD-10-CM | POA: Diagnosis not present

## 2020-07-22 DIAGNOSIS — R1310 Dysphagia, unspecified: Secondary | ICD-10-CM | POA: Diagnosis not present

## 2020-07-22 DIAGNOSIS — R5383 Other fatigue: Secondary | ICD-10-CM | POA: Diagnosis not present

## 2020-07-28 DIAGNOSIS — R0602 Shortness of breath: Secondary | ICD-10-CM | POA: Diagnosis not present

## 2020-07-28 DIAGNOSIS — M542 Cervicalgia: Secondary | ICD-10-CM | POA: Diagnosis not present

## 2020-07-28 DIAGNOSIS — R079 Chest pain, unspecified: Secondary | ICD-10-CM | POA: Diagnosis not present

## 2020-07-28 DIAGNOSIS — R072 Precordial pain: Secondary | ICD-10-CM | POA: Diagnosis not present

## 2020-07-29 DIAGNOSIS — R0602 Shortness of breath: Secondary | ICD-10-CM | POA: Diagnosis not present

## 2020-07-29 DIAGNOSIS — R079 Chest pain, unspecified: Secondary | ICD-10-CM | POA: Diagnosis not present

## 2020-07-30 ENCOUNTER — Encounter: Payer: Self-pay | Admitting: Physician Assistant

## 2020-08-03 ENCOUNTER — Ambulatory Visit: Payer: Medicare HMO | Admitting: Cardiology

## 2020-08-03 ENCOUNTER — Other Ambulatory Visit: Payer: Self-pay

## 2020-08-03 ENCOUNTER — Encounter: Payer: Self-pay | Admitting: Cardiology

## 2020-08-03 VITALS — BP 100/60 | HR 78 | Ht 63.5 in | Wt 144.0 lb

## 2020-08-03 DIAGNOSIS — I700 Atherosclerosis of aorta: Secondary | ICD-10-CM | POA: Diagnosis not present

## 2020-08-03 DIAGNOSIS — E782 Mixed hyperlipidemia: Secondary | ICD-10-CM | POA: Diagnosis not present

## 2020-08-03 DIAGNOSIS — R079 Chest pain, unspecified: Secondary | ICD-10-CM

## 2020-08-03 DIAGNOSIS — R002 Palpitations: Secondary | ICD-10-CM

## 2020-08-03 DIAGNOSIS — R0602 Shortness of breath: Secondary | ICD-10-CM

## 2020-08-03 DIAGNOSIS — Z01812 Encounter for preprocedural laboratory examination: Secondary | ICD-10-CM | POA: Diagnosis not present

## 2020-08-03 DIAGNOSIS — R072 Precordial pain: Secondary | ICD-10-CM | POA: Diagnosis not present

## 2020-08-03 MED ORDER — METOPROLOL TARTRATE 100 MG PO TABS: 100.0000 mg | ORAL_TABLET | Freq: Once | ORAL | 0 refills | Status: DC

## 2020-08-03 NOTE — Patient Instructions (Addendum)
Medication Instructions:  The current medical regimen is effective;  continue present plan and medications.  *If you need a refill on your cardiac medications before your next appointment, please call your pharmacy*  Lab Work: You will need blood work before your CT scan (BMP)  This will be scheduled once your CT has been schedule.  If you have labs (blood work) drawn today and your tests are completely normal, you will receive your results only by: Marland Kitchen MyChart Message (if you have MyChart) OR . A paper copy in the mail If you have any lab test that is abnormal or we need to change your treatment, we will call you to review the results.  Testing/Procedures: Your physician has requested that you have an echocardiogram. Echocardiography is a painless test that uses sound waves to create images of your heart. It provides your doctor with information about the size and shape of your heart and how well your heart's chambers and valves are working. This procedure takes approximately one hour. There are no restrictions for this procedure.   Your cardiac CT will be scheduled at:   [facility] [address] [place], [place] [address] [phone]  Please arrive at the [facility] main entrance (entrance A) of [facility] 30 minutes prior to test start time. Proceed to the [facility] Radiology Department (first floor) to check-in and test prep.  Please follow these instructions carefully (unless otherwise directed):  On the Night Before the Test: . Be sure to Drink plenty of water. . Do not consume any caffeinated/decaffeinated beverages or chocolate 12 hours prior to your test. . Do not take any antihistamines 12 hours prior to your test.  On the Day of the Test: . Drink plenty of water until 1 hour prior to the test. . Do not eat any food 4 hours prior to the test. . You may take your regular medications prior to the test.  . Take metoprolol (Lopressor) two hours  prior to test. . HOLD Furosemide/Hydrochlorothiazide morning of the test. . FEMALES- please wear underwire-free bra if available     After the Test: . Drink plenty of water. . After receiving IV contrast, you may experience a mild flushed feeling. This is normal. . On occasion, you may experience a mild rash up to 24 hours after the test. This is not dangerous. If this occurs, you can take Benadryl 25 mg and increase your fluid intake. . If you experience trouble breathing, this can be serious. If it is severe call 911 IMMEDIATELY. If it is mild, please call our office. . If you take any of these medications: Glipizide/Metformin, Avandament, Glucavance, please do not take 48 hours after completing test unless otherwise instructed.   Once we have confirmed authorization from your insurance company, we will call you to set up a date and time for your test. Based on how quickly your insurance processes prior authorizations requests, please allow up to 4 weeks to be contacted for scheduling your Cardiac CT appointment. Be advised that routine Cardiac CT appointments could be scheduled as many as 8 weeks after your provider has ordered it.  For non-scheduling related questions, please contact the cardiac imaging nurse navigator should you have any questions/concerns: Marchia Bond, Cardiac Imaging Nurse Navigator Gordy Clement, Cardiac Imaging Nurse Navigator [facility] Direct Office Dial: [phone]   For scheduling needs, including cancellations and rescheduling, please call [place], [phone].  Follow-Up: At [facility], you and your health  needs are our priority.  As part of our continuing mission to provide you with exceptional heart care, we have created designated Provider Care Teams.  These Care Teams include your primary Cardiologist (physician) and Advanced Practice Providers (APPs -  Physician Assistants and Nurse Practitioners) who all work together  to provide you with the care you need, when you need it.  We recommend signing up for the patient portal called "MyChart".  Sign up information is provided on this After Visit Summary.  MyChart is used to connect with patients for Virtual Visits (Telemedicine).  Patients are able to view lab/test results, encounter notes, upcoming appointments, etc.  Non-urgent messages can be sent to your provider as well.   To learn more about what you can do with MyChart, go to [email or web address].    Your next appointment:   6 month(s)  The format for your next appointment:   In Person  Provider:   Candee Furbish, MD   Thank you for choosing [facility]!!

## 2020-08-03 NOTE — Progress Notes (Signed)
Cardiology Office Note:    Date:  08/03/2020   ID:  Danielle Serrano, DOB 1950-07-03, MRN [id number]  PCP:  Mayra Neer, MD   [place]  Cardiologist:  Candee Furbish, MD  Advanced Practice Provider:  No care team member to display Electrophysiologist:  None       Referring MD: Mayra Neer, MD    History of Present Illness:    Danielle Serrano is a 70 y.o. female here for the follow-up of chest pain ER visit recently, prior palpitations prior presyncope.  In review of ER note from 07/28/2020, she complained of chest discomfort.  She endorsed a baseline chest pressure heaviness that is 1/10 in severity.  Frequently goes on walks without any exertional pain.  While she was driving she developed worsening right-sided chest discomfort radiating to her back.  She felt short winded.  Recently quit smoking in February.  An EKG showed nonspecific T wave changes.  Low voltage.  Stress test was encouraged.  She declined GI cocktail in the ER.  Troponins were normal.  D-dimer was normal.  Chest x-ray was normal.  Overall she still feels a low level of chest discomfort fairly chronically.  She has had some shortness of breath that has been concerning at times.  She is going to have a GI evaluation as well.  Her daughter works in the operating room at [employer].  Family history --MGM - died 77 --Mother has pacer at 64 --Brother has AFIB --Daughter has WPW. Had ablation  Tobacco use - sometimes would quit. Off and on.  Most recently quit in February.  No ETOH.   No DM, HTN, no Cancer, no bleeding    Past Medical History:  Diagnosis Date  . Depression   . LGSIL (low grade squamous intraepithelial dysplasia) 2012   +ECC +HRHPV  . Osteopenia 07/2010   t score - 2.2, FRAX 8.8%/1.3%  . Reflux   . SVD (spontaneous vaginal delivery)    x 3    Past Surgical History:  Procedure Laterality Date  . CERVICAL BIOPSY  W/ LOOP ELECTRODE EXCISION  07/2010    LGSIL positive ectocervical margin negative endocervical margin  . COLONOSCOPY    . HYSTEROSCOPY  89, 2001   w/D&C for benign polyp  . TOTAL ABDOMINAL HYSTERECTOMY W/ BILATERAL SALPINGOOPHORECTOMY  02/2012   persistent low-grade cervical dysplasia  . TUBAL LIGATION    . WISDOM TOOTH EXTRACTION      Current Medications: Current Meds  Medication Sig  . aspirin EC 81 MG tablet Take 1 tablet (81 mg total) by mouth daily. Swallow whole.  . Cholecalciferol (VITAMIN D3) 25 MCG (1000 UT) CAPS 1 capsule  . metoprolol tartrate (LOPRESSOR) 100 MG tablet Take 1 tablet (100 mg total) by mouth once for 1 dose. Take 1 tablet 2 hours before your CT scan  . Multiple Vitamins-Minerals (MULTIVITAMIN GUMMIES ADULT) CHEW See admin instructions.     Allergies:   Morphine sulfate and Penicillins   Social History   Socioeconomic History  . Marital status: Legally Separated    Spouse name: Not on file  . Number of children: Not on file  . Years of education: Not on file  . Highest education level: Not on file  Occupational History  . Not on file  Tobacco Use  . Smoking status: Current Every Day Smoker    Packs/day: 0.50    Years: 20.00    Pack years: 10.00    Types: Cigarettes  .  Smokeless tobacco: Never Used  . Tobacco comment: pt has smoked for 20 years, she quit smoking for a 2 year period and recently started back in the last few months.  Substance and Sexual Activity  . Alcohol use: Yes    Comment: occassional  . Drug use: No  . Sexual activity: Yes    Birth control/protection: Post-menopausal, Surgical  Other Topics Concern  . Not on file  Social History Narrative  . Not on file   Social Determinants of Health   Financial Resource Strain: Not on file  Food Insecurity: Not on file  Transportation Needs: Not on file  Physical Activity: Not on file  Stress: Not on file  Social Connections: Not on file     Family History: The patient's family history includes Colon cancer in her  father; Dementia in her mother.  ROS:   Please see the history of present illness.     All other systems reviewed and are negative.  EKGs/Labs/Other Studies Reviewed:    The following studies were reviewed today:  CT abdomen 2019-aortic atherosclerosis noted  Chest x-ray 2021 personally reviewed and interpreted shows normal lung and heart size.  Zio patch monitor 05/05/2020: Sinus rhythm with average heart rate of 90 bpm  PVCs and PACs.  Rare episodes of paroxysmal atrial tachycardia, fastest 174 bpm for 14 seconds.  No evidence of atrial fibrillation, no ventricular tachycardia, no pauses  Patient symptoms of fluttering, racing, lightheadedness were associated with sinus rhythm/sinus tachycardia with brief episode of paroxysmal atrial tachycardia   EKG: Prior EKG showed nonspecific ST-T wave changes  Recent Labs: No results found for requested labs within last 8760 hours.  Recent Lipid Panel No results found for: CHOL, TRIG, HDL, CHOLHDL, VLDL, LDLCALC, LDLDIRECT   Risk Assessment/Calculations:      Physical Exam:    VS:  BP 100/60 (BP Location: Left Arm, Patient Position: Sitting, Cuff Size: Normal)   Pulse 78   Ht 5' 3.5" (1.613 m)   Wt 144 lb (65.3 kg)   SpO2 98%   BMI 25.11 kg/m     Wt Readings from Last 3 Encounters:  08/03/20 144 lb (65.3 kg)  05/27/20 143 lb 6.4 oz (65 kg)  04/09/20 144 lb (65.3 kg)     GEN:  Well nourished, well developed in no acute distress HEENT: Normal NECK: No JVD; No carotid bruits LYMPHATICS: No lymphadenopathy CARDIAC: RRR, no murmurs, rubs, gallops RESPIRATORY:  Clear to auscultation without rales, wheezing or rhonchi  ABDOMEN: Soft, non-tender, non-distended MUSCULOSKELETAL:  No edema; No deformity  SKIN: Warm and dry NEUROLOGIC:  Alert and oriented x 3 PSYCHIATRIC:  Normal affect   ASSESSMENT:    1. Chest pain of uncertain etiology   2. Aortic atherosclerosis ([place])   3. Mixed hyperlipidemia   4.  Palpitations   5. Shortness of breath   6. Precordial pain   7. Pre-procedure lab exam    PLAN:    In order of problems listed above:  Chest pain/ SOB -Extensive review of ER notes.  Suggest coronary CT scan for further analysis.  We will also check an echocardiogram to ensure proper structure and function with her shortness of breath.  Palpitations, presyncope -Monitor described above fluttering could be PAT.  Okay to utilize metoprolol in the situation.  She is not needed the metoprolol recently.  Atherosclerosis of aorta, mixed hyperlipidemia -Noted on prior CT scan.  Aspirin 81, Crestor also encouraged at last visit on 05/27/2020.  She was very hesitant to  start medications.  If the CT scan does show significant atherosclerosis, we will once again encourage use.  Hyperlipidemia -Last LDL 168 total cholesterol 245 triglycerides 167, hemoglobin 13.2, creatinine 0.7, ALT 16.  Encouraged statin use at prior visit.  Tobacco use -Continue to encourage ongoing tobacco cessation.  Markedly increases her risk of heart attack, stroke.  She quit back in February.  Excellent.  51-month follow-up  Medication Adjustments/Labs and Tests Ordered: Current medicines are reviewed at length with the patient today.  Concerns regarding medicines are outlined above.  Orders Placed This Encounter  Procedures  . CT CORONARY MORPH W/CTA COR W/SCORE W/CA W/CM &/OR WO/CM  . CT CORONARY FRACTIONAL FLOW RESERVE DATA PREP  . CT CORONARY FRACTIONAL FLOW RESERVE FLUID ANALYSIS  . Basic metabolic panel  . ECHOCARDIOGRAM COMPLETE   Meds ordered this encounter  Medications  . metoprolol tartrate (LOPRESSOR) 100 MG tablet    Sig: Take 1 tablet (100 mg total) by mouth once for 1 dose. Take 1 tablet 2 hours before your CT scan    Dispense:  1 tablet    Refill:  0    Patient Instructions  Medication Instructions:  The current medical regimen is effective;  continue present plan and medications.  *If you  need a refill on your cardiac medications before your next appointment, please call your pharmacy*  Lab Work: You will need blood work before your CT scan (BMP)  This will be scheduled once your CT has been schedule.  If you have labs (blood work) drawn today and your tests are completely normal, you will receive your results only by: Marland Kitchen MyChart Message (if you have MyChart) OR . A paper copy in the mail If you have any lab test that is abnormal or we need to change your treatment, we will call you to review the results.  Testing/Procedures: Your physician has requested that you have an echocardiogram. Echocardiography is a painless test that uses sound waves to create images of your heart. It provides your doctor with information about the size and shape of your heart and how well your heart's chambers and valves are working. This procedure takes approximately one hour. There are no restrictions for this procedure.   Your cardiac CT will be scheduled at:   [facility] [address] [place], [place] [address] [phone]  Please arrive at the [facility] main entrance (entrance A) of [facility] 30 minutes prior to test start time. Proceed to the [facility] Radiology Department (first floor) to check-in and test prep.  Please follow these instructions carefully (unless otherwise directed):  On the Night Before the Test: . Be sure to Drink plenty of water. . Do not consume any caffeinated/decaffeinated beverages or chocolate 12 hours prior to your test. . Do not take any antihistamines 12 hours prior to your test.  On the Day of the Test: . Drink plenty of water until 1 hour prior to the test. . Do not eat any food 4 hours prior to the test. . You may take your regular medications prior to the test.  . Take metoprolol (Lopressor) two hours prior to test. . HOLD Furosemide/Hydrochlorothiazide morning of the test. . FEMALES- please wear underwire-free bra if  available     After the Test: . Drink plenty of water. . After receiving IV contrast, you may experience a mild flushed feeling. This is normal. . On occasion, you may experience a mild rash up to 24 hours after the  test. This is not dangerous. If this occurs, you can take Benadryl 25 mg and increase your fluid intake. . If you experience trouble breathing, this can be serious. If it is severe call 911 IMMEDIATELY. If it is mild, please call our office. . If you take any of these medications: Glipizide/Metformin, Avandament, Glucavance, please do not take 48 hours after completing test unless otherwise instructed.   Once we have confirmed authorization from your insurance company, we will call you to set up a date and time for your test. Based on how quickly your insurance processes prior authorizations requests, please allow up to 4 weeks to be contacted for scheduling your Cardiac CT appointment. Be advised that routine Cardiac CT appointments could be scheduled as many as 8 weeks after your provider has ordered it.  For non-scheduling related questions, please contact the cardiac imaging nurse navigator should you have any questions/concerns: Marchia Bond, Cardiac Imaging Nurse Navigator Gordy Clement, Cardiac Imaging Nurse Navigator [facility] Direct Office Dial: [phone]   For scheduling needs, including cancellations and rescheduling, please call [place], [phone].  Follow-Up: At [facility], you and your health needs are our priority.  As part of our continuing mission to provide you with exceptional heart care, we have created designated Provider Care Teams.  These Care Teams include your primary Cardiologist (physician) and Advanced Practice Providers (APPs -  Physician Assistants and Nurse Practitioners) who all work together to provide you with the care you need, when you need it.  We recommend signing up for the patient portal called  "MyChart".  Sign up information is provided on this After Visit Summary.  MyChart is used to connect with patients for Virtual Visits (Telemedicine).  Patients are able to view lab/test results, encounter notes, upcoming appointments, etc.  Non-urgent messages can be sent to your provider as well.   To learn more about what you can do with MyChart, go to [email or web address].    Your next appointment:   6 month(s)  The format for your next appointment:   In Person  Provider:   Candee Furbish, MD   Thank you for choosing [facility]!!        Signed, Candee Furbish, MD  08/03/2020 11:37 AM    [place]

## 2020-08-17 ENCOUNTER — Encounter: Payer: Self-pay | Admitting: Physician Assistant

## 2020-08-17 ENCOUNTER — Other Ambulatory Visit: Payer: Self-pay

## 2020-08-17 ENCOUNTER — Other Ambulatory Visit: Payer: Medicare HMO

## 2020-08-17 ENCOUNTER — Ambulatory Visit: Payer: Medicare HMO | Admitting: Physician Assistant

## 2020-08-17 DIAGNOSIS — Z800 Family history of malignant neoplasm of digestive organs: Secondary | ICD-10-CM

## 2020-08-17 DIAGNOSIS — I700 Atherosclerosis of aorta: Secondary | ICD-10-CM

## 2020-08-17 DIAGNOSIS — R0602 Shortness of breath: Secondary | ICD-10-CM | POA: Diagnosis not present

## 2020-08-17 DIAGNOSIS — Z01812 Encounter for preprocedural laboratory examination: Secondary | ICD-10-CM

## 2020-08-17 DIAGNOSIS — Z86010 Personal history of colonic polyps: Secondary | ICD-10-CM

## 2020-08-17 DIAGNOSIS — R079 Chest pain, unspecified: Secondary | ICD-10-CM

## 2020-08-17 MED ORDER — FAMOTIDINE 40 MG PO TABS: 40.0000 mg | ORAL_TABLET | Freq: Every day | ORAL | 6 refills | Status: AC

## 2020-08-17 MED ORDER — OMEPRAZOLE 40 MG PO CPDR: 40.0000 mg | DELAYED_RELEASE_CAPSULE | Freq: Every morning | ORAL | 6 refills | Status: AC

## 2020-08-17 NOTE — Progress Notes (Signed)
Subjective:    Patient ID: Danielle Serrano, female    DOB: 04-24-1951, 70 y.o.   MRN: [id number]  HPI Danielle Serrano is a pleasant 70 year old white female, new to GI today referred by Scarlett Presto, MD for evaluation of dysphagia.  Patient says she had previously been a patient of Dr. Oletta Lamas and had undergone prior colonoscopies with Dr. Oletta Lamas.  She thinks she may have had endoscopy in 2007, was not told of any stricture or other abnormality. She has history of hyperlipidemia, and is status post hysterectomy and BSO and had a ventral hernia repair in 2020.  She relates family history of colon cancer in her father who is deceased from colon cancer at age 80. We do have copies of prior colonoscopies which were done in 2000 with one 5 mm cecal polyp removed which was a tubular adenoma.  Colonoscopy in 2013 - with exception of internal hemorrhoids and colonoscopy in November 2019 with finding of a 5 cm polyp in the splenic flexure which was also a tubular adenoma.  She is indicated for 5-year interval follow-up.  She has no current lower GI complaints. She says she has been having problems with somewhat progressive heartburn and indigestion over the past 6 months.  She has had chronic problems with what she describes as GERD and has been on omeprazole 20 mg p.o. daily for several years.  She describes indigestion, a burning sensation and pressure in her chest which has been occurring intermittently with exertion and has been associated with some shortness of breath and is relieved by rest.  She also describes having some symptoms at rest and feels that her heartburn indigestion symptoms are worse postprandially.  She is not having any nocturnal symptoms.  She denies any dysphagia or odynophagia. She is undergoing evaluation with cardiology for the chest pain and was recently seen by Dr. Marlou Porch.  She is set up for coronary CT, and 2D echo Patient says she had also been placed on metoprolol by cardiology but  says that seem to aggravate her heartburn and she stopped it.   Review of Systems Pertinent positive and negative review of systems were noted in the above HPI section.  All other review of systems was otherwise negative.  Outpatient Encounter Medications as of 08/17/2020  Medication Sig  . aspirin EC 81 MG tablet Take 1 tablet (81 mg total) by mouth daily. Swallow whole.  . Cholecalciferol (VITAMIN D3) 25 MCG (1000 UT) CAPS 1 capsule  . famotidine (PEPCID) 40 MG tablet Take 1 tablet (40 mg total) by mouth at bedtime.  . Multiple Vitamins-Minerals (MULTIVITAMIN GUMMIES ADULT) CHEW See admin instructions.  Marland Kitchen omeprazole (PRILOSEC) 40 MG capsule Take 1 capsule (40 mg total) by mouth in the morning.  . [DISCONTINUED] metoprolol tartrate (LOPRESSOR) 100 MG tablet Take 1 tablet (100 mg total) by mouth once for 1 dose. Take 1 tablet 2 hours before your CT scan   No facility-administered encounter medications on file as of 08/17/2020.   Allergies  Allergen Reactions  . Morphine Sulfate     itching  . Penicillins Other (See Comments)    Unknown childhood allergy    Patient Active Problem List   Diagnosis Date Noted  . Hx of adenomatous colonic polyps 08/17/2020  . Family hx of colon cancer 08/17/2020  . ASCUS with positive high risk HPV 01/25/2012   Social History   Socioeconomic History  . Marital status: Divorced    Spouse name: Not on file  . Number of  children: 3  . Years of education: Not on file  . Highest education level: Not on file  Occupational History  . Not on file  Tobacco Use  . Smoking status: Former Smoker    Packs/day: 0.50    Years: 20.00    Pack years: 10.00    Types: Cigarettes  . Smokeless tobacco: Never Used  . Tobacco comment: pt has smoked for 20 years, she quit smoking for a 2 year period and recently started back in the last few months.  Substance and Sexual Activity  . Alcohol use: Yes    Comment: occassional  . Drug use: No  . Sexual activity: Yes     Birth control/protection: Post-menopausal, Surgical  Other Topics Concern  . Not on file  Social History Narrative  . Not on file   Social Determinants of Health   Financial Resource Strain: Not on file  Food Insecurity: Not on file  Transportation Needs: Not on file  Physical Activity: Not on file  Stress: Not on file  Social Connections: Not on file  Intimate Partner Violence: Not on file    Danielle Serrano's family history includes Colon cancer in her father; Dementia in her mother.      Objective:    Vitals:   08/17/20 1519  BP: 100/60  Pulse: 88    Physical Exam Well-developed well-nourished older WF  in no acute distress.  Height, Weight, BMI 26.8  HEENT; nontraumatic normocephalic, EOMI, PE R LA, sclera anicteric. Oropharynx; not examined today Neck; supple, no JVD Cardiovascular; regular rate and rhythm with S1-S2, no murmur rub or gallop Pulmonary; Clear bilaterally Abdomen; soft, nontender, nondistended, no palpable mass or hepatosplenomegaly, bowel sounds are active Rectal; not done Skin; benign exam, no jaundice rash or appreciable lesions Extremities; no clubbing cyanosis or edema skin warm and dry Neuro/Psych; alert and oriented x4, grossly nonfocal mood and affect appropriate       Assessment & Plan:   #81 70 year old white female with chronic history of GERD, previously managed with omeprazole 20 mg daily.  Now with 37-month history of somewhat progressive intermittent heartburn, indigestion, chest burning and pressure. She has had some exertional exacerbation of symptoms associated with shortness of breath and relieved with rest.  Also has symptoms at rest and postprandially though not associated with shortness of breath.  It is not clear that her symptoms are all secondary to GERD, cardiac evaluation is in progress, rule out angina, underlying coronary artery disease  Clearly she does have a component of chronic GERD which is not well  controlled.  #2 family history of colon cancer in patient's father deceased at age 68 #3 personal history of adenomatous colon polyps-up-to-date with colonoscopy last done November 2019 and indicated for 5-year interval follow-up/2024 #4 hyperlipidemia #5 status post hysterectomy and BSO #6 status post ventral hernia repair 2020  Plan; Start stricter antireflux regimen, diet was provided and antireflux regimen We will change omeprazole to 40 mg p.o. every morning and add trial of Pepcid 40 mg AC dinner, prescription sent Patient will benefit from EGD but would like her to complete the cardiac evaluation and be cleared from a cardiac standpoint prior to scheduling EGD with sedation.  Patient will be established with Dr. Havery Moros. We will also need to place her for recall colonoscopy November 2024. I have asked patient to call when she has completed cardiac work-up and has been cleared by Dr. Marlou Porch and will get her scheduled for EGD at that time.   Eldredge Veldhuizen  S Kodiak Rollyson PA-C 08/17/2020   Cc: Mayra Neer, MD

## 2020-08-17 NOTE — Patient Instructions (Signed)
If you are age 68 or older, your body mass index should be between 23-30. Your Body mass index is 26.89 kg/m. If this is out of the aforementioned range listed, please consider follow up with your Primary Care Provider.  If you are age 47 or younger, your body mass index should be between 19-25. Your Body mass index is 26.89 kg/m. If this is out of the aformentioned range listed, please consider follow up with your Primary Care Provider.   START Omeprazole 40 mg 1 capsule in the morning. START Famotidine 40 mg 1 tablet at bedtime. These have been sent to your pharmacy.  START and Anti-reflux regimen and diet.  Call the office and speak with Amy's nurse after you have completed your cardiac work up and they have you have been cleared.   Thank you for entrusting me with your care and choosing [facility].  Amy Esterwood, PA-C

## 2020-08-18 LAB — BASIC METABOLIC PANEL
BUN/Creatinine Ratio: 19 (ref 12–28)
BUN: 17 mg/dL (ref 8–27)
CO2: 23 mmol/L (ref 20–29)
Calcium: 9.3 mg/dL (ref 8.7–10.3)
Chloride: 102 mmol/L (ref 96–106)
Creatinine, Ser: 0.89 mg/dL (ref 0.57–1.00)
Glucose: 84 mg/dL (ref 65–99)
Potassium: 4 mmol/L (ref 3.5–5.2)
Sodium: 140 mmol/L (ref 134–144)
eGFR: 71 mL/min/{1.73_m2} (ref >59)

## 2020-08-19 ENCOUNTER — Other Ambulatory Visit: Payer: Medicare HMO

## 2020-08-19 ENCOUNTER — Telehealth (HOSPITAL_COMMUNITY): Payer: Self-pay | Admitting: *Deleted

## 2020-08-19 NOTE — Telephone Encounter (Signed)
Reaching out to patient to offer assistance regarding upcoming cardiac imaging study; pt verbalizes understanding of appt date/time, parking situation and where to check in, pre-test NPO status and medications ordered, and verified current allergies; name and call back number provided for further questions should they arise  Keala Drum RN Navigator Cardiac Imaging [facility] [phone] office [phone] cell  

## 2020-08-20 ENCOUNTER — Ambulatory Visit (HOSPITAL_COMMUNITY)
Admission: RE | Admit: 2020-08-20 | Discharge: 2020-08-20 | Disposition: A | Discharge status: Home / Self Care | Payer: Medicare HMO | Source: Ambulatory Visit | Attending: Cardiology | Admitting: Cardiology

## 2020-08-20 ENCOUNTER — Other Ambulatory Visit: Payer: Self-pay

## 2020-08-20 DIAGNOSIS — R072 Precordial pain: Secondary | ICD-10-CM | POA: Diagnosis not present

## 2020-08-20 MED ORDER — NITROGLYCERIN 0.4 MG SL SUBL
SUBLINGUAL_TABLET | SUBLINGUAL | Status: AC
  Filled 2020-08-20: qty 2

## 2020-08-20 MED ORDER — IOHEXOL 350 MG/ML SOLN
80.0000 mL | Freq: Once | INTRAVENOUS | Status: AC | PRN
  Administered 2020-08-20: 80 mL via INTRAVENOUS

## 2020-08-20 MED ORDER — NITROGLYCERIN 0.4 MG SL SUBL
0.8000 mg | SUBLINGUAL_TABLET | Freq: Once | SUBLINGUAL | Status: AC
  Administered 2020-08-20: 0.8 mg via SUBLINGUAL

## 2020-08-26 ENCOUNTER — Other Ambulatory Visit: Payer: Medicare HMO

## 2020-08-27 ENCOUNTER — Other Ambulatory Visit: Payer: Self-pay

## 2020-08-27 ENCOUNTER — Ambulatory Visit (HOSPITAL_COMMUNITY): Payer: Medicare HMO | Attending: Cardiology

## 2020-08-27 DIAGNOSIS — I700 Atherosclerosis of aorta: Secondary | ICD-10-CM | POA: Insufficient documentation

## 2020-08-27 DIAGNOSIS — R079 Chest pain, unspecified: Secondary | ICD-10-CM | POA: Diagnosis not present

## 2020-08-27 DIAGNOSIS — R0602 Shortness of breath: Secondary | ICD-10-CM | POA: Diagnosis not present

## 2020-08-27 LAB — ECHOCARDIOGRAM COMPLETE
Area-P 1/2: 4.65 cm2
S' Lateral: 1.9 cm

## 2020-08-30 ENCOUNTER — Telehealth: Payer: Self-pay | Admitting: Physician Assistant

## 2020-08-30 NOTE — Telephone Encounter (Signed)
Inbound call from patient. Cardio doctor states she is cleared for procedure but would need a form faxed so he can sign off on it. Dr. Candee Furbish is her doctor. Phone [phone]. She does not know the fax number.

## 2020-08-30 NOTE — Telephone Encounter (Signed)
Please send to the [facility] thank you

## 2020-08-31 ENCOUNTER — Ambulatory Visit: Payer: Medicare HMO | Admitting: Cardiology

## 2020-08-31 NOTE — Telephone Encounter (Signed)
I can see that she has had cardiac work-up per Dr. Marlou Porch and work-up was negative.  If you want to send Dr. Marlou Porch a note just to sign off on clearance for sedation for endoscopy we can do that.  Then please get her scheduled for EGD with Dr. Benita Stabile refractory GERD, thank you

## 2020-09-01 ENCOUNTER — Telehealth: Payer: Self-pay

## 2020-09-01 NOTE — Telephone Encounter (Signed)
Clearance has been sent to Cardiology

## 2020-09-01 NOTE — Telephone Encounter (Signed)
[facility] Pre-operative Risk Assessment     Request for surgical clearance:     Endoscopy Procedure  What type of surgery is being performed?     Endoscopy  When is this surgery scheduled?     TBD  What type of clearance is required ?   Sedation Clearance  Sedation being used? Propofol   Practice name and name of physician performing surgery?      [facility]  What is your office phone and fax number?      Phone- [phone]  [phone]  Anesthesia type (None, local, MAC, general) ?       MAC

## 2020-09-02 NOTE — Telephone Encounter (Signed)
Official clearance has been given by Cardiology (See Cardiology telephone note). Patient has been scheduled for 11/05/2020 at 10:00 am. She will come by the office to go over paperwork.

## 2020-09-02 NOTE — Telephone Encounter (Signed)
   Name: Danielle Serrano  DOB: 1950/12/25  MRN: [id number]   Primary Cardiologist: Candee Furbish, MD  Chart reviewed as part of pre-operative protocol coverage. Patient was contacted 09/02/2020 in reference to pre-operative risk assessment for pending surgery as outlined below.  Danielle Serrano was last seen on 08/03/20 by Dr. Marlou Porch.  Since that day, Danielle Serrano has done well from a cardiac perspective, no chest pain and stable shortness of breath. Recent echo and cardiac CT were unremarkable. METS>4. She is only on Aspirin.   Regarding ASA therapy, we recommend continuation of ASA throughout the perioperative period.  However, if the surgeon feels that cessation of ASA is required in the perioperative period, it may be stopped 5-7 days prior to surgery with a plan to resume it as soon as felt to be feasible from a surgical standpoint in the post-operative period.  Therefore, based on ACC/AHA guidelines, the patient would be at acceptable risk for the planned procedure without further cardiovascular testing.   The patient was advised that if she develops new symptoms prior to surgery to contact our office to arrange for a follow-up visit, and she verbalized understanding.  I will route this recommendation to the requesting party via Epic fax function and remove from pre-op pool. Please call with questions.  Garnett Nunziata Ninfa Meeker, PA-C 09/02/2020, 12:00 PM

## 2020-09-06 NOTE — Telephone Encounter (Signed)
Spoke with patient, due to the price of gas, she wanted her instructions mailed out to her would sign paperwork when she came in for her procedure.

## 2020-09-06 NOTE — Telephone Encounter (Signed)
Inbound call from patient. Called in to asked about what paperwork she is to go over and when is she suppose to come. Best contact number [phone].

## 2020-09-20 ENCOUNTER — Other Ambulatory Visit: Payer: Medicare HMO

## 2020-09-23 ENCOUNTER — Other Ambulatory Visit: Payer: Medicare HMO

## 2020-09-24 ENCOUNTER — Ambulatory Visit: Payer: Medicare HMO | Admitting: Cardiology

## 2020-11-05 ENCOUNTER — Ambulatory Visit (AMBULATORY_SURGERY_CENTER): Payer: Medicare HMO | Admitting: Gastroenterology

## 2020-11-05 ENCOUNTER — Encounter: Payer: Self-pay | Admitting: Gastroenterology

## 2020-11-05 ENCOUNTER — Other Ambulatory Visit: Payer: Self-pay

## 2020-11-05 VITALS — BP 135/79 | HR 68 | Temp 97.8°F | Resp 12 | Ht 62.0 in | Wt 147.0 lb

## 2020-11-05 DIAGNOSIS — K317 Polyp of stomach and duodenum: Secondary | ICD-10-CM

## 2020-11-05 DIAGNOSIS — K219 Gastro-esophageal reflux disease without esophagitis: Secondary | ICD-10-CM | POA: Diagnosis not present

## 2020-11-05 DIAGNOSIS — D132 Benign neoplasm of duodenum: Secondary | ICD-10-CM

## 2020-11-05 DIAGNOSIS — K2100 Gastro-esophageal reflux disease with esophagitis, without bleeding: Secondary | ICD-10-CM | POA: Diagnosis not present

## 2020-11-05 MED ORDER — SODIUM CHLORIDE 0.9 % IV SOLN: 500.0000 mL | Freq: Once | INTRAVENOUS | Status: DC

## 2020-11-05 NOTE — Progress Notes (Signed)
PT taken to PACU. Monitors in place. VSS. Report given to RN. 

## 2020-11-05 NOTE — Progress Notes (Signed)
Called to room to assist during endoscopic procedure.  Patient ID and intended procedure confirmed with present staff. Received instructions for my participation in the procedure from the performing physician.  

## 2020-11-05 NOTE — Patient Instructions (Signed)
Discharge instructions given. Biopsies taken. Handout on Hiatal Hernia. Resume previous medications. YOU HAD AN ENDOSCOPIC PROCEDURE TODAY AT [facility]:   Refer to the procedure report that was given to you for any specific questions about what was found during the examination.  If the procedure report does not answer your questions, please call your gastroenterologist to clarify.  If you requested that your care partner not be given the details of your procedure findings, then the procedure report has been included in a sealed envelope for you to review at your convenience later.  YOU SHOULD EXPECT: Some feelings of bloating in the abdomen. Passage of more gas than usual.  Walking can help get rid of the air that was put into your GI tract during the procedure and reduce the bloating. If you had a lower endoscopy (such as a colonoscopy or flexible sigmoidoscopy) you may notice spotting of blood in your stool or on the toilet paper. If you underwent a bowel prep for your procedure, you may not have a normal bowel movement for a few days.  Please Note:  You might notice some irritation and congestion in your nose or some drainage.  This is from the oxygen used during your procedure.  There is no need for concern and it should clear up in a day or so.  SYMPTOMS TO REPORT IMMEDIATELY:   Following upper endoscopy (EGD)  Vomiting of blood or coffee ground material  New chest pain or pain under the shoulder blades  Painful or persistently difficult swallowing  New shortness of breath  Fever of 100F or higher  Black, tarry-looking stools  For urgent or emergent issues, a gastroenterologist can be reached at any hour by calling [phone]. Do not use MyChart messaging for urgent concerns.    DIET:  We do recommend a small meal at first, but then you may proceed to your regular diet.  Drink plenty of fluids but you should avoid alcoholic beverages for 24 hours.  ACTIVITY:   You should plan to take it easy for the rest of today and you should NOT DRIVE or use heavy machinery until tomorrow (because of the sedation medicines used during the test).    FOLLOW UP: Our staff will call the number listed on your records 48-72 hours following your procedure to check on you and address any questions or concerns that you may have regarding the information given to you following your procedure. If we do not reach you, we will leave a message.  We will attempt to reach you two times.  During this call, we will ask if you have developed any symptoms of COVID 19. If you develop any symptoms (ie: fever, flu-like symptoms, shortness of breath, cough etc.) before then, please call [phone].  If you test positive for Covid 19 in the 2 weeks post procedure, please call and report this information to [place].    If any biopsies were taken you will be contacted by phone or by letter within the next 1-3 weeks.  Please call us at [phone] if you have not heard about the biopsies in 3 weeks.    SIGNATURES/CONFIDENTIALITY: You and/or your care partner have signed paperwork which will be entered into your electronic medical record.  These signatures attest to the fact that that the information above on your After Visit Summary has been reviewed and is understood.  Full responsibility of the confidentiality of this discharge information lies with you and/or your care-partner.

## 2020-11-05 NOTE — Op Note (Signed)
[facility] Patient Name: Devanshi Califf Procedure Date: 11/05/2020 10:11 AM MRN: [id number] Endoscopist: Remo Lipps P. Havery Moros , MD Age: 70 Referring MD:  Date of Birth: Feb 21, 1951 Gender: Female Account #: [id number] Procedure:                Upper GI endoscopy Indications:              history of gastro-esophageal reflux disease - poor                            control on omeprazole 20mg  / day, recently                            omeprazole increased to 40mg  / day and pepcid 40mg                             added as well with much better control of symptoms Medicines:                Monitored Anesthesia Care Procedure:                Pre-Anesthesia Assessment:                           - Prior to the procedure, a History and Physical                            was performed, and patient medications and                            allergies were reviewed. The patient's tolerance of                            previous anesthesia was also reviewed. The risks                            and benefits of the procedure and the sedation                            options and risks were discussed with the patient.                            All questions were answered, and informed consent                            was obtained. Prior Anticoagulants: The patient has                            taken no previous anticoagulant or antiplatelet                            agents. ASA Grade Assessment: II - A patient with                            mild systemic disease. After reviewing the risks  and benefits, the patient was deemed in                            satisfactory condition to undergo the procedure.                           After obtaining informed consent, the endoscope was                            passed under direct vision. Throughout the                            procedure, the patient's blood pressure, pulse, and                             oxygen saturations were monitored continuously. The                            Endoscope was introduced through the mouth, and                            advanced to the second part of duodenum. The upper                            GI endoscopy was accomplished without difficulty.                            The patient tolerated the procedure well. Scope In: Scope Out: Findings:                 Esophagogastric landmarks were identified: the                            Z-line was found at 36 cm, the gastroesophageal                            junction was found at 36 cm and the upper extent of                            the gastric folds was found at 38 cm from the                            incisors.                           A 2 cm hiatal hernia was present.                           Mucosal changes were found at the gastroesophageal                            junction, localized area of altered mucosa,  slightly raised. Biopsies were taken with a cold                            forceps for histology to ensure no dysplastic                            changes.                           The exam of the esophagus was otherwise normal.                           A single 2 to 3 mm sessile polyp was found in the                            gastric fundus. The polyp was removed with a cold                            biopsy forceps. Resection and retrieval were                            complete.                           The exam of the stomach was otherwise normal.                           A single 3 mm sessile polyp was found in the distal                            duodenal bulb. The polyp was removed with a cold                            biopsy forceps. Resection and retrieval were                            complete.                           The exam of the duodenum was otherwise normal.                            Ampulla visualized and normal in  appearance Complications:            No immediate complications. Estimated blood loss:                            Minimal. Estimated Blood Loss:     Estimated blood loss was minimal. Impression:               - Esophagogastric landmarks identified.                           - 2 cm hiatal hernia.                           -  Mucosal changes in the GEJ as outlined. Biopsied.                           - A single gastric polyp. Resected and retrieved.                           - A single duodenal polyp. Resected and retrieved.                           - Normal exam otherwise Recommendation:           - Patient has a contact number available for                            emergencies. The signs and symptoms of potential                            delayed complications were discussed with the                            patient. Return to normal activities tomorrow.                            Written discharge instructions were provided to the                            patient.                           - Resume previous diet.                           - Continue present medications.                           - Continue dosing of omeprazole 40mg  / day if that                            works to control symptoms, and can use pepcid PRN                            for breakthrough. Long term use lowest dose of                            medication needed to control symptoms.                           - Await pathology results. Remo Lipps P. Reina Wilton, MD 11/05/2020 10:33:55 AM This report has been signed electronically.

## 2020-11-09 ENCOUNTER — Telehealth: Payer: Self-pay

## 2020-11-09 NOTE — Telephone Encounter (Signed)
  Follow up Call-  Call back number 11/05/2020  Post procedure Call Back phone  # [phone]  Permission to leave phone message Yes  Some recent data might be hidden     Patient questions:  Do you have a fever, pain , or abdominal swelling? No. Pain Score  0 *  Have you tolerated food without any problems? Yes.    Have you been able to return to your normal activities? Yes.    Do you have any questions about your discharge instructions: Diet   No. Medications  No. Follow up visit  No.  Do you have questions or concerns about your Care? No.  Actions: * If pain score is 4 or above: No action needed, pain <4.  Have you developed a fever since your procedure? no  2.   Have you had an respiratory symptoms (SOB or cough) since your procedure? no  3.   Have you tested positive for COVID 19 since your procedure no  4.   Have you had any family members/close contacts diagnosed with the COVID 19 since your procedure?  no   If yes to any of these questions please route to Joylene John, RN and Joella Prince, RN

## 2021-02-04 ENCOUNTER — Ambulatory Visit: Payer: Medicare HMO | Admitting: Cardiology

## 2021-05-03 DIAGNOSIS — D485 Neoplasm of uncertain behavior of skin: Secondary | ICD-10-CM | POA: Diagnosis not present

## 2021-06-24 DIAGNOSIS — M8580 Other specified disorders of bone density and structure, unspecified site: Secondary | ICD-10-CM | POA: Diagnosis not present

## 2021-06-24 DIAGNOSIS — I700 Atherosclerosis of aorta: Secondary | ICD-10-CM | POA: Diagnosis not present

## 2021-06-24 DIAGNOSIS — K594 Anal spasm: Secondary | ICD-10-CM | POA: Diagnosis not present

## 2021-06-24 DIAGNOSIS — K219 Gastro-esophageal reflux disease without esophagitis: Secondary | ICD-10-CM | POA: Diagnosis not present

## 2021-06-24 DIAGNOSIS — Z0000 Encounter for general adult medical examination without abnormal findings: Secondary | ICD-10-CM | POA: Diagnosis not present

## 2021-06-24 DIAGNOSIS — E782 Mixed hyperlipidemia: Secondary | ICD-10-CM | POA: Diagnosis not present

## 2021-06-24 DIAGNOSIS — F411 Generalized anxiety disorder: Secondary | ICD-10-CM | POA: Diagnosis not present

## 2021-12-08 DIAGNOSIS — D225 Melanocytic nevi of trunk: Secondary | ICD-10-CM | POA: Diagnosis not present

## 2021-12-08 DIAGNOSIS — L578 Other skin changes due to chronic exposure to nonionizing radiation: Secondary | ICD-10-CM | POA: Diagnosis not present

## 2021-12-08 DIAGNOSIS — L814 Other melanin hyperpigmentation: Secondary | ICD-10-CM | POA: Diagnosis not present

## 2021-12-08 DIAGNOSIS — D485 Neoplasm of uncertain behavior of skin: Secondary | ICD-10-CM | POA: Diagnosis not present

## 2022-01-18 DIAGNOSIS — F17200 Nicotine dependence, unspecified, uncomplicated: Secondary | ICD-10-CM | POA: Diagnosis not present

## 2022-01-18 DIAGNOSIS — R1013 Epigastric pain: Secondary | ICD-10-CM | POA: Diagnosis not present

## 2022-01-19 ENCOUNTER — Other Ambulatory Visit: Payer: Self-pay | Admitting: Family Medicine

## 2022-01-19 DIAGNOSIS — R1013 Epigastric pain: Secondary | ICD-10-CM

## 2022-01-24 ENCOUNTER — Ambulatory Visit
Admission: RE | Admit: 2022-01-24 | Discharge: 2022-01-24 | Disposition: A | Discharge status: Home / Self Care | Payer: No Typology Code available for payment source | Source: Ambulatory Visit | Attending: Family Medicine | Admitting: Family Medicine

## 2022-01-24 DIAGNOSIS — R1013 Epigastric pain: Secondary | ICD-10-CM

## 2022-01-24 DIAGNOSIS — I700 Atherosclerosis of aorta: Secondary | ICD-10-CM | POA: Diagnosis not present

## 2022-02-07 DIAGNOSIS — H1032 Unspecified acute conjunctivitis, left eye: Secondary | ICD-10-CM | POA: Diagnosis not present

## 2022-02-09 DIAGNOSIS — F17211 Nicotine dependence, cigarettes, in remission: Secondary | ICD-10-CM | POA: Diagnosis not present

## 2022-02-09 DIAGNOSIS — Z6824 Body mass index (BMI) 24.0-24.9, adult: Secondary | ICD-10-CM | POA: Diagnosis not present

## 2022-02-09 DIAGNOSIS — I700 Atherosclerosis of aorta: Secondary | ICD-10-CM | POA: Diagnosis not present

## 2022-02-09 DIAGNOSIS — K219 Gastro-esophageal reflux disease without esophagitis: Secondary | ICD-10-CM | POA: Diagnosis not present

## 2022-02-09 DIAGNOSIS — Z008 Encounter for other general examination: Secondary | ICD-10-CM | POA: Diagnosis not present

## 2022-04-06 DIAGNOSIS — I700 Atherosclerosis of aorta: Secondary | ICD-10-CM | POA: Diagnosis not present

## 2022-04-06 DIAGNOSIS — Z6824 Body mass index (BMI) 24.0-24.9, adult: Secondary | ICD-10-CM | POA: Diagnosis not present

## 2022-04-06 DIAGNOSIS — F17211 Nicotine dependence, cigarettes, in remission: Secondary | ICD-10-CM | POA: Diagnosis not present

## 2022-04-06 DIAGNOSIS — Z008 Encounter for other general examination: Secondary | ICD-10-CM | POA: Diagnosis not present

## 2022-04-06 DIAGNOSIS — K219 Gastro-esophageal reflux disease without esophagitis: Secondary | ICD-10-CM | POA: Diagnosis not present

## 2022-04-07 DIAGNOSIS — D485 Neoplasm of uncertain behavior of skin: Secondary | ICD-10-CM | POA: Diagnosis not present

## 2022-04-07 DIAGNOSIS — D2372 Other benign neoplasm of skin of left lower limb, including hip: Secondary | ICD-10-CM | POA: Diagnosis not present

## 2022-04-07 DIAGNOSIS — L570 Actinic keratosis: Secondary | ICD-10-CM | POA: Diagnosis not present

## 2022-04-07 DIAGNOSIS — D2239 Melanocytic nevi of other parts of face: Secondary | ICD-10-CM | POA: Diagnosis not present

## 2022-04-07 DIAGNOSIS — D225 Melanocytic nevi of trunk: Secondary | ICD-10-CM | POA: Diagnosis not present

## 2022-04-07 DIAGNOSIS — L820 Inflamed seborrheic keratosis: Secondary | ICD-10-CM | POA: Diagnosis not present

## 2022-07-06 DIAGNOSIS — M8580 Other specified disorders of bone density and structure, unspecified site: Secondary | ICD-10-CM | POA: Diagnosis not present

## 2022-07-06 DIAGNOSIS — R32 Unspecified urinary incontinence: Secondary | ICD-10-CM | POA: Diagnosis not present

## 2022-07-06 DIAGNOSIS — Z0000 Encounter for general adult medical examination without abnormal findings: Secondary | ICD-10-CM | POA: Diagnosis not present

## 2022-07-06 DIAGNOSIS — H52223 Regular astigmatism, bilateral: Secondary | ICD-10-CM | POA: Diagnosis not present

## 2022-07-06 DIAGNOSIS — R400 Somnolence: Secondary | ICD-10-CM | POA: Diagnosis not present

## 2022-07-06 DIAGNOSIS — E782 Mixed hyperlipidemia: Secondary | ICD-10-CM | POA: Diagnosis not present

## 2022-07-06 DIAGNOSIS — F411 Generalized anxiety disorder: Secondary | ICD-10-CM | POA: Diagnosis not present

## 2022-07-06 DIAGNOSIS — R5383 Other fatigue: Secondary | ICD-10-CM | POA: Diagnosis not present

## 2022-07-06 DIAGNOSIS — I700 Atherosclerosis of aorta: Secondary | ICD-10-CM | POA: Diagnosis not present

## 2022-07-06 DIAGNOSIS — Z720 Tobacco use: Secondary | ICD-10-CM | POA: Diagnosis not present

## 2022-07-06 DIAGNOSIS — K219 Gastro-esophageal reflux disease without esophagitis: Secondary | ICD-10-CM | POA: Diagnosis not present

## 2022-07-06 DIAGNOSIS — Z1211 Encounter for screening for malignant neoplasm of colon: Secondary | ICD-10-CM | POA: Diagnosis not present

## 2022-07-26 DIAGNOSIS — M8589 Other specified disorders of bone density and structure, multiple sites: Secondary | ICD-10-CM | POA: Diagnosis not present

## 2022-07-26 DIAGNOSIS — Z1231 Encounter for screening mammogram for malignant neoplasm of breast: Secondary | ICD-10-CM | POA: Diagnosis not present

## 2022-09-06 DIAGNOSIS — G4700 Insomnia, unspecified: Secondary | ICD-10-CM | POA: Diagnosis not present

## 2022-09-06 DIAGNOSIS — G4719 Other hypersomnia: Secondary | ICD-10-CM | POA: Diagnosis not present

## 2022-09-06 DIAGNOSIS — R0683 Snoring: Secondary | ICD-10-CM | POA: Diagnosis not present

## 2022-09-08 DIAGNOSIS — R32 Unspecified urinary incontinence: Secondary | ICD-10-CM | POA: Diagnosis not present

## 2022-09-08 DIAGNOSIS — N3941 Urge incontinence: Secondary | ICD-10-CM | POA: Diagnosis not present

## 2022-09-20 DIAGNOSIS — R32 Unspecified urinary incontinence: Secondary | ICD-10-CM | POA: Diagnosis not present

## 2022-09-20 DIAGNOSIS — N8110 Cystocele, unspecified: Secondary | ICD-10-CM | POA: Diagnosis not present

## 2022-09-20 DIAGNOSIS — N3946 Mixed incontinence: Secondary | ICD-10-CM | POA: Diagnosis not present

## 2022-09-27 DIAGNOSIS — G4700 Insomnia, unspecified: Secondary | ICD-10-CM | POA: Diagnosis not present

## 2022-09-27 DIAGNOSIS — G4733 Obstructive sleep apnea (adult) (pediatric): Secondary | ICD-10-CM | POA: Diagnosis not present

## 2022-10-04 DIAGNOSIS — R39198 Other difficulties with micturition: Secondary | ICD-10-CM | POA: Diagnosis not present

## 2022-10-04 DIAGNOSIS — Z79899 Other long term (current) drug therapy: Secondary | ICD-10-CM | POA: Diagnosis not present

## 2022-10-04 DIAGNOSIS — N8110 Cystocele, unspecified: Secondary | ICD-10-CM | POA: Diagnosis not present

## 2022-10-04 DIAGNOSIS — N3946 Mixed incontinence: Secondary | ICD-10-CM | POA: Diagnosis not present

## 2022-10-04 DIAGNOSIS — N993 Prolapse of vaginal vault after hysterectomy: Secondary | ICD-10-CM | POA: Diagnosis not present

## 2022-10-04 DIAGNOSIS — N393 Stress incontinence (female) (male): Secondary | ICD-10-CM | POA: Diagnosis not present

## 2022-10-17 DIAGNOSIS — N3946 Mixed incontinence: Secondary | ICD-10-CM | POA: Diagnosis not present

## 2022-10-17 DIAGNOSIS — Z48816 Encounter for surgical aftercare following surgery on the genitourinary system: Secondary | ICD-10-CM | POA: Diagnosis not present

## 2022-10-17 DIAGNOSIS — R32 Unspecified urinary incontinence: Secondary | ICD-10-CM | POA: Diagnosis not present

## 2022-10-23 DIAGNOSIS — R42 Dizziness and giddiness: Secondary | ICD-10-CM | POA: Diagnosis not present

## 2022-11-02 DIAGNOSIS — G4733 Obstructive sleep apnea (adult) (pediatric): Secondary | ICD-10-CM | POA: Diagnosis not present

## 2022-11-06 DIAGNOSIS — R32 Unspecified urinary incontinence: Secondary | ICD-10-CM | POA: Diagnosis not present

## 2022-12-02 DIAGNOSIS — G4733 Obstructive sleep apnea (adult) (pediatric): Secondary | ICD-10-CM | POA: Diagnosis not present

## 2022-12-12 DIAGNOSIS — H8111 Benign paroxysmal vertigo, right ear: Secondary | ICD-10-CM | POA: Diagnosis not present

## 2023-01-02 DIAGNOSIS — G4733 Obstructive sleep apnea (adult) (pediatric): Secondary | ICD-10-CM | POA: Diagnosis not present

## 2023-02-02 DIAGNOSIS — G4733 Obstructive sleep apnea (adult) (pediatric): Secondary | ICD-10-CM | POA: Diagnosis not present

## 2023-03-04 DIAGNOSIS — G4733 Obstructive sleep apnea (adult) (pediatric): Secondary | ICD-10-CM | POA: Diagnosis not present

## 2023-03-23 DIAGNOSIS — M19072 Primary osteoarthritis, left ankle and foot: Secondary | ICD-10-CM | POA: Diagnosis not present

## 2023-03-28 ENCOUNTER — Encounter: Payer: Self-pay | Admitting: Gastroenterology

## 2023-04-04 DIAGNOSIS — G4733 Obstructive sleep apnea (adult) (pediatric): Secondary | ICD-10-CM | POA: Diagnosis not present

## 2023-04-13 DIAGNOSIS — D122 Benign neoplasm of ascending colon: Secondary | ICD-10-CM | POA: Diagnosis not present

## 2023-04-13 DIAGNOSIS — D128 Benign neoplasm of rectum: Secondary | ICD-10-CM | POA: Diagnosis not present

## 2023-04-13 DIAGNOSIS — D120 Benign neoplasm of cecum: Secondary | ICD-10-CM | POA: Diagnosis not present

## 2023-04-13 DIAGNOSIS — Z860100 Personal history of colon polyps, unspecified: Secondary | ICD-10-CM | POA: Diagnosis not present

## 2023-04-13 DIAGNOSIS — Z09 Encounter for follow-up examination after completed treatment for conditions other than malignant neoplasm: Secondary | ICD-10-CM | POA: Diagnosis not present

## 2023-04-13 DIAGNOSIS — Z800 Family history of malignant neoplasm of digestive organs: Secondary | ICD-10-CM | POA: Diagnosis not present

## 2023-04-13 DIAGNOSIS — K5730 Diverticulosis of large intestine without perforation or abscess without bleeding: Secondary | ICD-10-CM | POA: Diagnosis not present

## 2023-04-17 DIAGNOSIS — D120 Benign neoplasm of cecum: Secondary | ICD-10-CM | POA: Diagnosis not present

## 2023-04-17 DIAGNOSIS — D122 Benign neoplasm of ascending colon: Secondary | ICD-10-CM | POA: Diagnosis not present

## 2023-04-17 DIAGNOSIS — D128 Benign neoplasm of rectum: Secondary | ICD-10-CM | POA: Diagnosis not present

## 2023-05-04 DIAGNOSIS — G4733 Obstructive sleep apnea (adult) (pediatric): Secondary | ICD-10-CM | POA: Diagnosis not present

## 2023-05-11 DIAGNOSIS — L570 Actinic keratosis: Secondary | ICD-10-CM | POA: Diagnosis not present

## 2023-05-11 DIAGNOSIS — L821 Other seborrheic keratosis: Secondary | ICD-10-CM | POA: Diagnosis not present

## 2023-05-11 DIAGNOSIS — D485 Neoplasm of uncertain behavior of skin: Secondary | ICD-10-CM | POA: Diagnosis not present

## 2023-05-11 DIAGNOSIS — D225 Melanocytic nevi of trunk: Secondary | ICD-10-CM | POA: Diagnosis not present

## 2023-05-11 DIAGNOSIS — D2239 Melanocytic nevi of other parts of face: Secondary | ICD-10-CM | POA: Diagnosis not present

## 2023-06-04 DIAGNOSIS — G4733 Obstructive sleep apnea (adult) (pediatric): Secondary | ICD-10-CM | POA: Diagnosis not present

## 2023-06-15 DIAGNOSIS — C44619 Basal cell carcinoma of skin of left upper limb, including shoulder: Secondary | ICD-10-CM | POA: Diagnosis not present

## 2023-07-05 DIAGNOSIS — G4733 Obstructive sleep apnea (adult) (pediatric): Secondary | ICD-10-CM | POA: Diagnosis not present

## 2023-07-10 DIAGNOSIS — F411 Generalized anxiety disorder: Secondary | ICD-10-CM | POA: Diagnosis not present

## 2023-07-10 DIAGNOSIS — K219 Gastro-esophageal reflux disease without esophagitis: Secondary | ICD-10-CM | POA: Diagnosis not present

## 2023-07-10 DIAGNOSIS — M810 Age-related osteoporosis without current pathological fracture: Secondary | ICD-10-CM | POA: Diagnosis not present

## 2023-07-10 DIAGNOSIS — E782 Mixed hyperlipidemia: Secondary | ICD-10-CM | POA: Diagnosis not present

## 2023-07-10 DIAGNOSIS — H8110 Benign paroxysmal vertigo, unspecified ear: Secondary | ICD-10-CM | POA: Diagnosis not present

## 2023-07-10 DIAGNOSIS — R110 Nausea: Secondary | ICD-10-CM | POA: Diagnosis not present

## 2023-07-10 DIAGNOSIS — Z0000 Encounter for general adult medical examination without abnormal findings: Secondary | ICD-10-CM | POA: Diagnosis not present

## 2023-07-10 DIAGNOSIS — Z720 Tobacco use: Secondary | ICD-10-CM | POA: Diagnosis not present

## 2023-08-02 DIAGNOSIS — G4733 Obstructive sleep apnea (adult) (pediatric): Secondary | ICD-10-CM | POA: Diagnosis not present

## 2023-08-24 DIAGNOSIS — G4733 Obstructive sleep apnea (adult) (pediatric): Secondary | ICD-10-CM | POA: Diagnosis not present

## 2023-09-02 DIAGNOSIS — G4733 Obstructive sleep apnea (adult) (pediatric): Secondary | ICD-10-CM | POA: Diagnosis not present

## 2023-09-21 DIAGNOSIS — C44619 Basal cell carcinoma of skin of left upper limb, including shoulder: Secondary | ICD-10-CM | POA: Diagnosis not present

## 2023-09-21 DIAGNOSIS — D485 Neoplasm of uncertain behavior of skin: Secondary | ICD-10-CM | POA: Diagnosis not present

## 2023-10-02 DIAGNOSIS — G4733 Obstructive sleep apnea (adult) (pediatric): Secondary | ICD-10-CM | POA: Diagnosis not present

## 2023-11-02 DIAGNOSIS — G4733 Obstructive sleep apnea (adult) (pediatric): Secondary | ICD-10-CM | POA: Diagnosis not present

## 2024-02-28 ENCOUNTER — Encounter: Payer: Self-pay | Admitting: Family Medicine

## 2024-02-28 ENCOUNTER — Other Ambulatory Visit: Payer: Self-pay | Admitting: Family Medicine

## 2024-02-28 DIAGNOSIS — R1013 Epigastric pain: Secondary | ICD-10-CM

## 2024-03-10 ENCOUNTER — Ambulatory Visit
Admission: RE | Admit: 2024-03-10 | Discharge: 2024-03-10 | Disposition: A | Discharge status: Home / Self Care | Source: Ambulatory Visit | Attending: Family Medicine | Admitting: Family Medicine

## 2024-03-10 DIAGNOSIS — R1013 Epigastric pain: Secondary | ICD-10-CM

## 2024-03-10 MED ORDER — IOPAMIDOL (ISOVUE-300) INJECTION 61%
100.0000 mL | Freq: Once | INTRAVENOUS | Status: AC | PRN
  Administered 2024-03-10: 100 mL via INTRAVENOUS
